# Patient Record
Sex: Male | Born: 1990
Health system: Southern US, Community
[De-identification: ages and names within clinical notes are randomized; demographics above are authoritative.]

---
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
## 2013-12-31 ENCOUNTER — Encounter: Payer: Self-pay | Admitting: Sports Medicine

## 2013-12-31 ENCOUNTER — Ambulatory Visit (INDEPENDENT_AMBULATORY_CARE_PROVIDER_SITE_OTHER): Payer: 59 | Admitting: Sports Medicine

## 2013-12-31 VITALS — BP 130/81 | HR 114 | Ht 62.0 in | Wt 142.0 lb

## 2013-12-31 DIAGNOSIS — M25569 Pain in unspecified knee: Secondary | ICD-10-CM

## 2013-12-31 DIAGNOSIS — M222X9 Patellofemoral disorders, unspecified knee: Secondary | ICD-10-CM | POA: Insufficient documentation

## 2013-12-31 NOTE — Assessment & Plan Note (Signed)
Hip abductors and vastus medialis are poorly defined. Continue OTC analgesics, formal PT. I shouldn't be able to push his legs down after he returns in 6 weeks.

## 2013-12-31 NOTE — Progress Notes (Signed)
   Subjective:    I'm seeing this patient as a consultation for:  Dr. Foy Guadalajara  CC: Bilateral knee pain  HPI: This is a pleasant 23 year old male, he's on and off knee pain he localizes in the anterior aspect worse with flexion of the knees, squatting, going up and downstairs. Recently one of his male friend gave him a dead leg kick in the back of his right knee, he caught his weight with his left knee causing excruciating anterior pain. That has since resolved on its own but he wonders how to prevent this in the future. Pain is now mild, persistent.  Past medical history, Surgical history, Family history not pertinant except as noted below, Social history, Allergies, and medications have been entered into the medical record, reviewed, and no changes needed.   Review of Systems: No headache, visual changes, nausea, vomiting, diarrhea, constipation, dizziness, abdominal pain, skin rash, fevers, chills, night sweats, weight loss, swollen lymph nodes, body aches, joint swelling, muscle aches, chest pain, shortness of breath, mood changes, visual or auditory hallucinations.   Objective:   General: Well Developed, well nourished, and in no acute distress.  Neuro/Psych: Alert and oriented x3, extra-ocular muscles intact, able to move all 4 extremities, sensation grossly intact. Skin: Warm and dry, no rashes noted.  Respiratory: Not using accessory muscles, speaking in full sentences, trachea midline.  Cardiovascular: Pulses palpable, no extremity edema. Abdomen: Does not appear distended. Bilateral Knee: Normal to inspection with no erythema or effusion or obvious bony abnormalities. Tender to palpation over the medial and lateral patellar facets bilaterally. ROM full in flexion and extension and lower leg rotation. Ligaments with solid consistent endpoints including ACL, PCL, LCL, MCL. Negative Mcmurray's, Apley's, and Thessalonian tests. Non painful patellar compression. Patellar glide without  crepitus. Patellar and quadriceps tendons unremarkable. Hamstring and quadriceps strength is normal.  Poor vastus medialis definition, exquisitely weak hip abductor's.  Impression and Recommendations:   This case required medical decision making of moderate complexity.

## 2014-01-02 ENCOUNTER — Ambulatory Visit (INDEPENDENT_AMBULATORY_CARE_PROVIDER_SITE_OTHER): Payer: 59 | Admitting: Physical Therapy

## 2014-01-02 DIAGNOSIS — R262 Difficulty in walking, not elsewhere classified: Secondary | ICD-10-CM

## 2014-01-02 DIAGNOSIS — M25569 Pain in unspecified knee: Secondary | ICD-10-CM

## 2014-01-02 DIAGNOSIS — M6281 Muscle weakness (generalized): Secondary | ICD-10-CM

## 2014-01-07 ENCOUNTER — Encounter (INDEPENDENT_AMBULATORY_CARE_PROVIDER_SITE_OTHER): Payer: 59 | Admitting: Physical Therapy

## 2014-01-07 DIAGNOSIS — R262 Difficulty in walking, not elsewhere classified: Secondary | ICD-10-CM

## 2014-01-07 DIAGNOSIS — M6281 Muscle weakness (generalized): Secondary | ICD-10-CM

## 2014-01-07 DIAGNOSIS — M25569 Pain in unspecified knee: Secondary | ICD-10-CM

## 2014-01-09 ENCOUNTER — Encounter: Payer: 59 | Admitting: Physical Therapy

## 2014-01-09 DIAGNOSIS — M25569 Pain in unspecified knee: Secondary | ICD-10-CM

## 2014-01-09 DIAGNOSIS — R262 Difficulty in walking, not elsewhere classified: Secondary | ICD-10-CM

## 2014-01-09 DIAGNOSIS — M6281 Muscle weakness (generalized): Secondary | ICD-10-CM

## 2014-01-12 ENCOUNTER — Encounter: Payer: 59 | Admitting: Physical Therapy

## 2014-01-14 ENCOUNTER — Encounter (INDEPENDENT_AMBULATORY_CARE_PROVIDER_SITE_OTHER): Payer: 59 | Admitting: Physical Therapy

## 2014-01-14 DIAGNOSIS — M6281 Muscle weakness (generalized): Secondary | ICD-10-CM

## 2014-01-14 DIAGNOSIS — M25569 Pain in unspecified knee: Secondary | ICD-10-CM

## 2014-01-14 DIAGNOSIS — R262 Difficulty in walking, not elsewhere classified: Secondary | ICD-10-CM

## 2014-01-20 ENCOUNTER — Encounter (INDEPENDENT_AMBULATORY_CARE_PROVIDER_SITE_OTHER): Payer: 59 | Admitting: Physical Therapy

## 2014-01-20 DIAGNOSIS — M25569 Pain in unspecified knee: Secondary | ICD-10-CM

## 2014-01-20 DIAGNOSIS — M6281 Muscle weakness (generalized): Secondary | ICD-10-CM

## 2014-01-20 DIAGNOSIS — R262 Difficulty in walking, not elsewhere classified: Secondary | ICD-10-CM

## 2014-01-22 ENCOUNTER — Encounter (INDEPENDENT_AMBULATORY_CARE_PROVIDER_SITE_OTHER): Payer: 59 | Admitting: Physical Therapy

## 2014-01-22 DIAGNOSIS — M6281 Muscle weakness (generalized): Secondary | ICD-10-CM

## 2014-01-22 DIAGNOSIS — R262 Difficulty in walking, not elsewhere classified: Secondary | ICD-10-CM

## 2014-01-22 DIAGNOSIS — M25569 Pain in unspecified knee: Secondary | ICD-10-CM

## 2014-01-26 ENCOUNTER — Encounter (INDEPENDENT_AMBULATORY_CARE_PROVIDER_SITE_OTHER): Payer: 59 | Admitting: Physical Therapy

## 2014-01-26 DIAGNOSIS — R262 Difficulty in walking, not elsewhere classified: Secondary | ICD-10-CM

## 2014-01-26 DIAGNOSIS — M6281 Muscle weakness (generalized): Secondary | ICD-10-CM

## 2014-01-26 DIAGNOSIS — M25569 Pain in unspecified knee: Secondary | ICD-10-CM

## 2014-02-10 ENCOUNTER — Telehealth: Payer: Self-pay | Admitting: Sports Medicine

## 2014-02-10 NOTE — Telephone Encounter (Signed)
Mom called. She wants to know if Fayrene FearingJames can go back to work.  She has scheduled him for an appt for rechk.

## 2014-02-10 NOTE — Telephone Encounter (Signed)
If he is doing better, okay to go to work.

## 2014-02-17 ENCOUNTER — Ambulatory Visit (INDEPENDENT_AMBULATORY_CARE_PROVIDER_SITE_OTHER): Payer: 59 | Admitting: Sports Medicine

## 2014-02-17 VITALS — BP 118/70 | HR 79 | Ht 75.0 in | Wt 140.0 lb

## 2014-02-17 DIAGNOSIS — M25569 Pain in unspecified knee: Secondary | ICD-10-CM

## 2014-02-17 DIAGNOSIS — M222X9 Patellofemoral disorders, unspecified knee: Secondary | ICD-10-CM

## 2014-02-17 NOTE — Progress Notes (Signed)
  Subjective:    CC: Followup  HPI: Bilateral patellofemoral syndrome: Resolved with formal physical therapy, Mobic, and hip abductor rehabilitation.  Past medical history, Surgical history, Family history not pertinant except as noted below, Social history, Allergies, and medications have been entered into the medical record, reviewed, and no changes needed.   Review of Systems: No fevers, chills, night sweats, weight loss, chest pain, or shortness of breath.   Objective:    General: Well Developed, well nourished, and in no acute distress.  Neuro: Alert and oriented x3, extra-ocular muscles intact, sensation grossly intact.  HEENT: Normocephalic, atraumatic, pupils equal round reactive to light, neck supple, no masses, no lymphadenopathy, thyroid nonpalpable.  Skin: Warm and dry, no rashes. Cardiac: Regular rate and rhythm, no murmurs rubs or gallops, no lower extremity edema.  Respiratory: Clear to auscultation bilaterally. Not using accessory muscles, speaking in full sentences. Bilateral Knee: Normal to inspection with no erythema or effusion or obvious bony abnormalities. Palpation normal with no warmth or joint line tenderness or patellar tenderness or condyle tenderness. ROM normal in flexion and extension and lower leg rotation. Ligaments with solid consistent endpoints including ACL, PCL, LCL, MCL. Negative Mcmurray's and provocative meniscal tests. Non painful patellar compression. Patellar and quadriceps tendons unremarkable. Hamstring and quadriceps strength is normal. Good hip abduction strength.  Impression and Recommendations:

## 2014-02-17 NOTE — Assessment & Plan Note (Signed)
Pain-free after formal physical therapy. Hip abductor strength is now excellent. Return for custom orthotics. I will turn him loose afterwards.

## 2014-02-26 ENCOUNTER — Ambulatory Visit (INDEPENDENT_AMBULATORY_CARE_PROVIDER_SITE_OTHER): Payer: 59 | Admitting: Sports Medicine

## 2014-02-26 ENCOUNTER — Encounter: Payer: Self-pay | Admitting: Sports Medicine

## 2014-02-26 VITALS — BP 121/65 | HR 74 | Ht 75.0 in | Wt 139.0 lb

## 2014-02-26 DIAGNOSIS — M222X9 Patellofemoral disorders, unspecified knee: Secondary | ICD-10-CM

## 2014-02-26 DIAGNOSIS — M25569 Pain in unspecified knee: Secondary | ICD-10-CM

## 2014-02-26 NOTE — Assessment & Plan Note (Signed)
Continues to do well, custom orthotics as above. 

## 2014-02-26 NOTE — Progress Notes (Signed)

## 2015-02-23 ENCOUNTER — Ambulatory Visit (INDEPENDENT_AMBULATORY_CARE_PROVIDER_SITE_OTHER): Payer: 59 | Admitting: Sports Medicine

## 2015-02-23 ENCOUNTER — Encounter: Payer: Self-pay | Admitting: Sports Medicine

## 2015-02-23 ENCOUNTER — Ambulatory Visit (INDEPENDENT_AMBULATORY_CARE_PROVIDER_SITE_OTHER): Payer: 59

## 2015-02-23 VITALS — BP 128/66 | HR 78 | Ht 74.0 in | Wt 144.0 lb

## 2015-02-23 DIAGNOSIS — R0789 Other chest pain: Secondary | ICD-10-CM | POA: Diagnosis not present

## 2015-02-23 DIAGNOSIS — R0781 Pleurodynia: Secondary | ICD-10-CM | POA: Diagnosis not present

## 2015-02-23 LAB — COMPREHENSIVE METABOLIC PANEL
ALT: 16 U/L (ref 9–46)
AST: 13 U/L (ref 10–40)
Albumin: 4.7 g/dL (ref 3.6–5.1)
Alkaline Phosphatase: 73 U/L (ref 40–115)
BUN: 13 mg/dL (ref 7–25)
CO2: 26 mEq/L (ref 20–31)
Calcium: 9.2 mg/dL (ref 8.6–10.3)
Chloride: 106 mEq/L (ref 98–110)
Creat: 0.71 mg/dL (ref 0.60–1.35)
Glucose, Bld: 62 mg/dL — ABNORMAL LOW (ref 65–99)
Potassium: 4.1 mEq/L (ref 3.5–5.3)
Sodium: 140 mEq/L (ref 135–146)
Total Bilirubin: 3.2 mg/dL — ABNORMAL HIGH (ref 0.2–1.2)
Total Protein: 6.4 g/dL (ref 6.1–8.1)

## 2015-02-23 LAB — CBC
HCT: 44.5 % (ref 39.0–52.0)
Hemoglobin: 15.3 g/dL (ref 13.0–17.0)
MCH: 30.4 pg (ref 26.0–34.0)
MCHC: 34.4 g/dL (ref 30.0–36.0)
MCV: 88.5 fL (ref 78.0–100.0)
MPV: 9.3 fL (ref 8.6–12.4)
Platelets: 214 10*3/uL (ref 150–400)
RBC: 5.03 MIL/uL (ref 4.22–5.81)
RDW: 13.9 % (ref 11.5–15.5)
WBC: 5.9 10*3/uL (ref 4.0–10.5)

## 2015-02-23 LAB — CK: Total CK: 30 U/L (ref 7–232)

## 2015-02-23 NOTE — Progress Notes (Signed)
   Subjective:    I'm seeing this patient as a consultation for:  Dr. Foy Guadalajara  CC: Chest pain  HPI: This is a pleasant 24 year old male, he is referred to me for further evaluation of persistent pain that he's had in the left side of his chest for approximately 6 months now. He was in a weightlifting class, and started to have pain. 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 Unfortunately the pain is moderate, persistent and today is localized just below the nipple line to the left of the midline at the costochondral junction. Denies any trauma, pain is only minimally pleuritic, no anginal type pain with exertion, no sweating, diaphoresis, nausea. No other risk factors for thromboembolism. No constitutional symptoms.  Past medical history, Surgical history, Family history not pertinant except as noted below, Social history, Allergies, and medications have been entered into the medical record, reviewed, and no changes needed.   Review of Systems: No headache, visual changes, nausea, vomiting, diarrhea, constipation, dizziness, abdominal pain, skin rash, fevers, chills, night sweats, weight loss, swollen lymph nodes, body aches, joint swelling, muscle aches, chest pain, shortness of breath, mood changes, visual or auditory hallucinations.   Objective:   General: Well Developed, well nourished, and in no acute distress.  Neuro/Psych: Alert and oriented x3, extra-ocular muscles intact, able to move all 4 extremities, sensation grossly intact. Skin: Warm and dry, no rashes noted.  Respiratory: Not using accessory muscles, speaking in full sentences, trachea midline. Lungs are clear to auscultation bilaterally, there is tenderness to palpation at the left costochondral junction below the nipple. Cardiovascular: Pulses palpable, no extremity edema. Regular rate and rhythm, no murmurs, rubs, or gallops. Abdomen: Does not appear distended.  The chest was strapped with  compressive dressing. This resolved his pain.  Chest x-ray/rib series is negative.  Impression and Recommendations:   This case required medical decision making of moderate complexity.

## 2015-02-23 NOTE — Assessment & Plan Note (Signed)
Unlikely cardiac or thrombi embolism. Tender to palpation along the left costochondral junction below the nipple. This likely represents costochondritis versus rib stress fracture. Rib x-rays, meloxicam, strapped chest. We are going to check some blood work, he did start a weightlifting class, we are going to look at his d-dimer and CK levels, looking for rhabdomyolysis.

## 2015-02-24 LAB — VITAMIN D 25 HYDROXY (VIT D DEFICIENCY, FRACTURES): Vit D, 25-Hydroxy: 31 ng/mL (ref 30–100)

## 2015-02-24 LAB — D-DIMER, QUANTITATIVE: D-Dimer, Quant: 0.27 ug/mL-FEU (ref 0.00–0.48)

## 2015-04-02 ENCOUNTER — Ambulatory Visit (INDEPENDENT_AMBULATORY_CARE_PROVIDER_SITE_OTHER): Payer: 59

## 2015-04-02 ENCOUNTER — Ambulatory Visit (INDEPENDENT_AMBULATORY_CARE_PROVIDER_SITE_OTHER): Payer: 59 | Admitting: Sports Medicine

## 2015-04-02 ENCOUNTER — Ambulatory Visit: Payer: 59 | Admitting: Physician Assistant

## 2015-04-02 ENCOUNTER — Encounter: Payer: Self-pay | Admitting: Sports Medicine

## 2015-04-02 DIAGNOSIS — M222X9 Patellofemoral disorders, unspecified knee: Secondary | ICD-10-CM

## 2015-04-02 DIAGNOSIS — R0789 Other chest pain: Secondary | ICD-10-CM | POA: Diagnosis not present

## 2015-04-02 DIAGNOSIS — M954 Acquired deformity of chest and rib: Secondary | ICD-10-CM | POA: Diagnosis not present

## 2015-04-02 MED ORDER — IOHEXOL 300 MG/ML  SOLN
75.0000 mL | Freq: Once | INTRAMUSCULAR | Status: AC | PRN
  Administered 2015-04-02: 75 mL via INTRAVENOUS

## 2015-04-02 NOTE — Assessment & Plan Note (Signed)
Asymptomatic and no treatment needed.

## 2015-04-02 NOTE — Assessment & Plan Note (Signed)
All but resolved with rehabilitation exercises and custom orthotics.

## 2015-04-02 NOTE — Progress Notes (Signed)
  Subjective:    CC: Follow-up  HPI: Bruce Hudson returns, we treated him for left clinical costochondritis at the last visit, he does have pectus excavatum, and frequently has recurrence of left costochondral pain.  He did extremely well with oral NSAIDs, as well as strapping of the chest wall, and pain is only minimal and intermittent now. He still has a fairly significant prominence of the left anterior chest wall below the nipple.  Patellofemoral pain syndrome: All but resolved with custom orthotics and home rehabilitation exercises.  Hyperbilirubinemia: Most likely related to Gilbert's syndrome, all other liver function tests are unremarkable, no visible jaundice.  Past medical history, Surgical history, Family history not pertinant except as noted below, Social history, Allergies, and medications have been entered into the medical record, reviewed, and no changes needed.   Review of Systems: No fevers, chills, night sweats, weight loss, chest pain, or shortness of breath.   Objective:    General: Well Developed, well nourished, and in no acute distress.  Neuro: Alert and oriented x3, extra-ocular muscles intact, sensation grossly intact.  HEENT: Normocephalic, atraumatic, pupils equal round reactive to light, neck supple, no masses, no lymphadenopathy, thyroid nonpalpable.  Skin: Warm and dry, no rashes. Cardiac: Regular rate and rhythm, no murmurs rubs or gallops, no lower extremity edema.  Respiratory: Clear to auscultation bilaterally. Not using accessory muscles, speaking in full sentences. Still has minimal tenderness palpation over the costochondral junction at the nipple line on the left side with visible and palpable prominence of the chest wall on the side.  CT scan reviewed and shows pectus excavated with sclerosis of all bones, and significant prominence of the left costochondral junction, the sternum is rotated.  Impression and Recommendations:    I spent 25 minutes with this  patient, greater than 50% was face-to-face time counseling regarding the above diagnoses

## 2015-04-02 NOTE — Assessment & Plan Note (Signed)
Improved significantly with conservative measures, still has some protrusion of the left-sided costal cartilage, we are going to CT scan with IV contrast for further evaluation.

## 2015-04-06 ENCOUNTER — Other Ambulatory Visit: Payer: Self-pay | Admitting: Sports Medicine

## 2015-04-06 MED ORDER — MELOXICAM 15 MG PO TABS: 15.0000 mg | ORAL_TABLET | Freq: Every day | ORAL | Status: DC

## 2015-04-06 NOTE — Telephone Encounter (Signed)
Pt wife called to request a refill on Pt's anti-inflammatory Rx. Meloxicam  one tablet daily. Will route to PCP for review.

## 2015-05-07 ENCOUNTER — Ambulatory Visit: Payer: 59 | Admitting: Sports Medicine

## 2015-05-12 ENCOUNTER — Other Ambulatory Visit: Payer: Self-pay | Admitting: Sports Medicine

## 2015-06-11 ENCOUNTER — Encounter: Payer: Self-pay | Admitting: Sports Medicine

## 2015-06-11 ENCOUNTER — Ambulatory Visit (INDEPENDENT_AMBULATORY_CARE_PROVIDER_SITE_OTHER): Payer: 59 | Admitting: Sports Medicine

## 2015-06-11 VITALS — BP 120/71 | HR 92 | Wt 140.0 lb

## 2015-06-11 DIAGNOSIS — R0789 Other chest pain: Secondary | ICD-10-CM | POA: Diagnosis not present

## 2015-06-11 NOTE — Progress Notes (Signed)
  Subjective:    CC: Follow-up  HPI: Pectus excavatum:  Fayrene FearingJames returns, he had some asymmetry over his left anterior chest wall, he has known pectus excavated and has been working aggressively on [employer] isotonics, overall his pain is significantly decreased simply with NSAIDs and chest rehabilitation. He only has minimal concern about the appearance and the asymmetry, and agrees to work harder on building chest bulk in the gym. CT scan was negative, and no further evaluation is needed, patient understands this.   Past medical history, Surgical history, Family history not pertinant except as noted below, Social history, Allergies, and medications have been entered into the medical record, reviewed, and no changes needed.   Review of Systems: No fevers, chills, night sweats, weight loss, chest pain, or shortness of breath.   Objective:    General: Well Developed, well nourished, and in no acute distress.  Neuro: Alert and oriented x3, extra-ocular muscles intact, sensation grossly intact.  HEENT: Normocephalic, atraumatic, pupils equal round reactive to light, neck supple, no masses, no lymphadenopathy, thyroid nonpalpable.  Skin: Warm and dry, no rashes. Cardiac: Regular rate and rhythm, no murmurs rubs or gallops, no lower extremity edema.  Respiratory: Clear to auscultation bilaterally. Not using accessory muscles, speaking in full sentences.  CT scan reviewed, there is visible pectus excavated with rotation of the sternum, no visible masses, no other abnormalities.  Impression and Recommendations:

## 2015-06-11 NOTE — Assessment & Plan Note (Signed)
Pectus excavatum, normal CT scan with the exception of some rotation of the sternum, no further intervention needed, he is working on chest bulk in the gym, and NSAIDs have provided good pain relief.

## 2015-06-24 ENCOUNTER — Other Ambulatory Visit: Payer: Self-pay | Admitting: Sports Medicine

## 2015-11-03 ENCOUNTER — Other Ambulatory Visit: Payer: Self-pay | Admitting: Sports Medicine

## 2015-11-04 ENCOUNTER — Ambulatory Visit (INDEPENDENT_AMBULATORY_CARE_PROVIDER_SITE_OTHER): Payer: 59 | Admitting: Sports Medicine

## 2015-11-04 ENCOUNTER — Encounter: Payer: Self-pay | Admitting: Sports Medicine

## 2015-11-04 VITALS — BP 138/89 | HR 101 | Resp 18 | Wt 141.9 lb

## 2015-11-04 DIAGNOSIS — F988 Other specified behavioral and emotional disorders with onset usually occurring in childhood and adolescence: Secondary | ICD-10-CM | POA: Insufficient documentation

## 2015-11-04 DIAGNOSIS — F909 Attention-deficit hyperactivity disorder, unspecified type: Secondary | ICD-10-CM

## 2015-11-04 DIAGNOSIS — F419 Anxiety disorder, unspecified: Principal | ICD-10-CM

## 2015-11-04 DIAGNOSIS — F418 Other specified anxiety disorders: Secondary | ICD-10-CM | POA: Diagnosis not present

## 2015-11-04 DIAGNOSIS — F32A Depression, unspecified: Secondary | ICD-10-CM | POA: Insufficient documentation

## 2015-11-04 DIAGNOSIS — F329 Major depressive disorder, single episode, unspecified: Secondary | ICD-10-CM

## 2015-11-04 MED ORDER — CITALOPRAM HYDROBROMIDE 10 MG PO TABS: 10.0000 mg | ORAL_TABLET | Freq: Every day | ORAL | Status: DC

## 2015-11-04 MED ORDER — MELOXICAM 15 MG PO TABS: 15.0000 mg | ORAL_TABLET | Freq: Every day | ORAL | Status: DC

## 2015-11-04 MED ORDER — LISDEXAMFETAMINE DIMESYLATE 20 MG PO CAPS: ORAL_CAPSULE | ORAL | Status: DC

## 2015-11-04 NOTE — Assessment & Plan Note (Signed)
Has never had full neuropsychiatric testing for attention deficit disorder, I do suspect his underlying diagnosis has been anxiety and depression all alone. Down taper of Vyvanse, and starting Celexa as below.

## 2015-11-04 NOTE — Progress Notes (Signed)
  Subjective:    CC:  Follow-up  HPI: This is a pleasant 25 year old male, he has an existing diagnosis of attention deficit disorder, he tells me that he never had neuropsychiatric testing, simply was placed on Vyvanse, and hasn't really noted a huge improvement in his symptoms,  He does tell me he endorses more anxiety and depressed mood, he is agreeable to try an antidepressant and do a trial off of his stimulant.  Past medical history, Surgical history, Family history not pertinant except as noted below, Social history, Allergies, and medications have been entered into the medical record, reviewed, and no changes needed.   Review of Systems: No fevers, chills, night sweats, weight loss, chest pain, or shortness of breath.   Objective:    General: Well Developed, well nourished, and in no acute distress.  Neuro: Alert and oriented x3, extra-ocular muscles intact, sensation grossly intact.  HEENT: Normocephalic, atraumatic, pupils equal round reactive to light, neck supple, no masses, no lymphadenopathy, thyroid nonpalpable.  Skin: Warm and dry, no rashes. Cardiac: Regular rate and rhythm, no murmurs rubs or gallops, no lower extremity edema.  Respiratory: Clear to auscultation bilaterally. Not using accessory muscles, speaking in full sentences.  Impression and Recommendations:    I spent 25 minutes with this patient, greater than 50% was face-to-face time counseling regarding the above diagnoses

## 2015-11-04 NOTE — Assessment & Plan Note (Signed)
Starting Celexa 10, return in one month for a PHQ9 and GAD7

## 2015-11-08 ENCOUNTER — Ambulatory Visit: Payer: 59 | Admitting: Sports Medicine

## 2015-12-06 ENCOUNTER — Encounter: Payer: Self-pay | Admitting: Sports Medicine

## 2015-12-06 ENCOUNTER — Ambulatory Visit (INDEPENDENT_AMBULATORY_CARE_PROVIDER_SITE_OTHER): Payer: 59 | Admitting: Sports Medicine

## 2015-12-06 VITALS — BP 137/82 | HR 86 | Resp 16 | Wt 145.2 lb

## 2015-12-06 DIAGNOSIS — F419 Anxiety disorder, unspecified: Principal | ICD-10-CM

## 2015-12-06 DIAGNOSIS — F329 Major depressive disorder, single episode, unspecified: Secondary | ICD-10-CM

## 2015-12-06 DIAGNOSIS — F32A Depression, unspecified: Secondary | ICD-10-CM

## 2015-12-06 DIAGNOSIS — F418 Other specified anxiety disorders: Secondary | ICD-10-CM | POA: Diagnosis not present

## 2015-12-06 MED ORDER — CITALOPRAM HYDROBROMIDE 10 MG PO TABS: 10.0000 mg | ORAL_TABLET | Freq: Every day | ORAL | Status: DC

## 2015-12-06 NOTE — Assessment & Plan Note (Signed)
Fantastic improvement in symptoms on 10 mg of Celexa, symptoms are well-controlled, concentration has improved, and he is off of Vyvanse. As discussed previously, I do think he actually has a diagnosis of attention deficit disorder. Refilling medication, return in one year

## 2015-12-06 NOTE — Progress Notes (Signed)
  Subjective:    CC: follow-up  HPI: This is a pleasant 25 year old male, he comes in with fantastic improvements on 10 mg of Celexa and coming off of his Vyvanse, concentration really hasn't worsened, and is controlled right now off of his stimulant. He endorses moderate difficulty sleeping, mild anhedonia, guilt, and difficulty concentrating.Bruce Hudson has mild anxiety, worry, and fear of impending doom. All of these symptoms have improved significantly since his last visit.  Past medical history, Surgical history, Family history not pertinant except as noted below, Social history, Allergies, and medications have been entered into the medical record, reviewed, and no changes needed.   Review of Systems: No fevers, chills, night sweats, weight loss, chest pain, or shortness of breath.   Objective:    General: Well Developed, well nourished, and in no acute distress.  Neuro: Alert and oriented x3, extra-ocular muscles intact, sensation grossly intact.  HEENT: Normocephalic, atraumatic, pupils equal round reactive to light, neck supple, no masses, no lymphadenopathy, thyroid nonpalpable.  Skin: Warm and dry, no rashes. Cardiac: Regular rate and rhythm, no murmurs rubs or gallops, no lower extremity edema.  Respiratory: Clear to auscultation bilaterally. Not using accessory muscles, speaking in full sentences.  Impression and Recommendations:

## 2016-01-17 ENCOUNTER — Telehealth: Payer: Self-pay | Admitting: Sports Medicine

## 2016-01-17 DIAGNOSIS — F329 Major depressive disorder, single episode, unspecified: Secondary | ICD-10-CM

## 2016-01-17 DIAGNOSIS — F32A Depression, unspecified: Secondary | ICD-10-CM

## 2016-01-17 DIAGNOSIS — F419 Anxiety disorder, unspecified: Principal | ICD-10-CM

## 2016-01-17 MED ORDER — CITALOPRAM HYDROBROMIDE 20 MG PO TABS: 20.0000 mg | ORAL_TABLET | Freq: Every day | ORAL | Status: DC

## 2016-01-17 NOTE — Telephone Encounter (Signed)
Increased to 20 mg, max dose is 40 mg.

## 2016-01-17 NOTE — Telephone Encounter (Signed)
Pt called clinic to see if Celexa could be increased? Pt states it is not helping as well as it did. Will route to PCP for review.

## 2016-01-18 NOTE — Telephone Encounter (Signed)
Pt advised of new Rx. Verbalized understanding, no further questions.

## 2016-06-04 ENCOUNTER — Other Ambulatory Visit: Payer: Self-pay | Admitting: Sports Medicine

## 2016-06-04 DIAGNOSIS — F32A Depression, unspecified: Secondary | ICD-10-CM

## 2016-06-04 DIAGNOSIS — F329 Major depressive disorder, single episode, unspecified: Secondary | ICD-10-CM

## 2016-06-04 DIAGNOSIS — F419 Anxiety disorder, unspecified: Principal | ICD-10-CM

## 2016-12-02 ENCOUNTER — Other Ambulatory Visit: Payer: Self-pay | Admitting: Sports Medicine

## 2016-12-02 DIAGNOSIS — F329 Major depressive disorder, single episode, unspecified: Secondary | ICD-10-CM

## 2016-12-02 DIAGNOSIS — F32A Depression, unspecified: Secondary | ICD-10-CM

## 2016-12-02 DIAGNOSIS — F419 Anxiety disorder, unspecified: Principal | ICD-10-CM

## 2016-12-14 IMAGING — CT CT CHEST W/ CM
3 series · 18 of 29 positions shown, 19 images · IV contrast (omnipaque)
Comparison: Plain film 02/23/2015

CLINICAL DATA: Masses on chest LEFT of mid line. Mass present for
11 years which is half the life of the patient.

EXAM:
CT CHEST WITH CONTRAST
TECHNIQUE: Multidetector CT imaging of the chest was performed during
intravenous contrast administration.
CONTRAST:  75mL OMNIPAQUE IOHEXOL 300 MG/ML  SOLN

[Series 2: chest with · axial · 0.76mm/px · z∈[-329,-149]mm · 4 of 72 slices shown, 5 images]
[im 18/72  mediastinal]
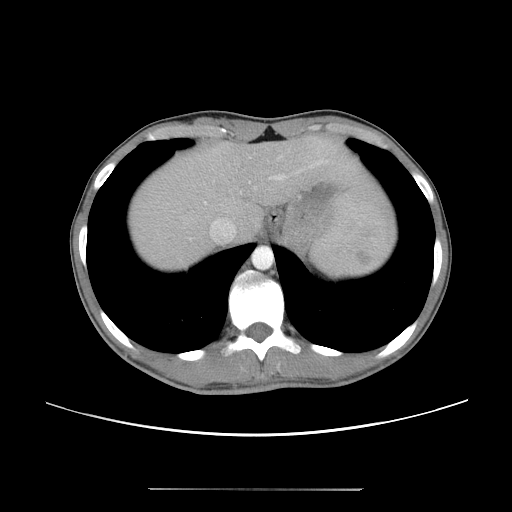
[im 18/72  lung]
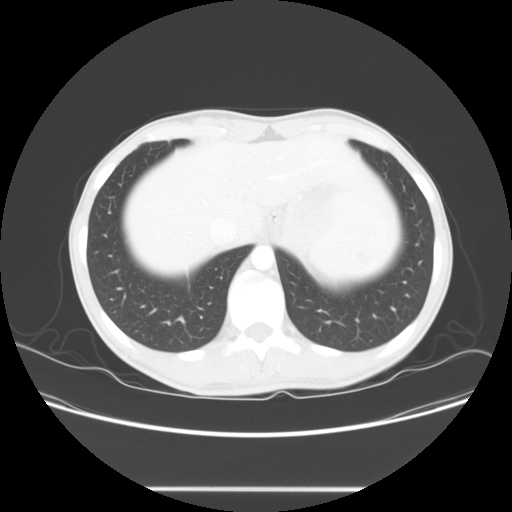
[im 36/72  lung]
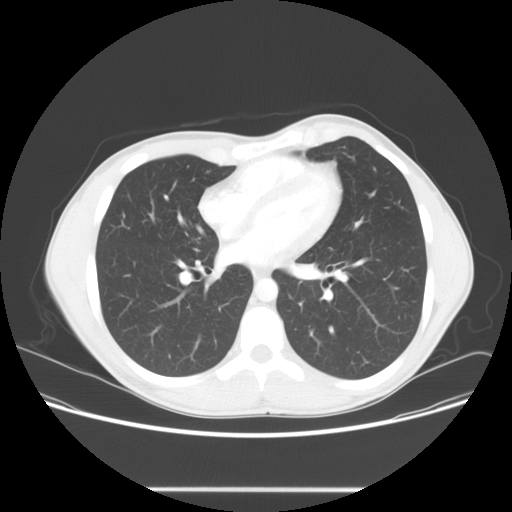
[im 37/72  lung]
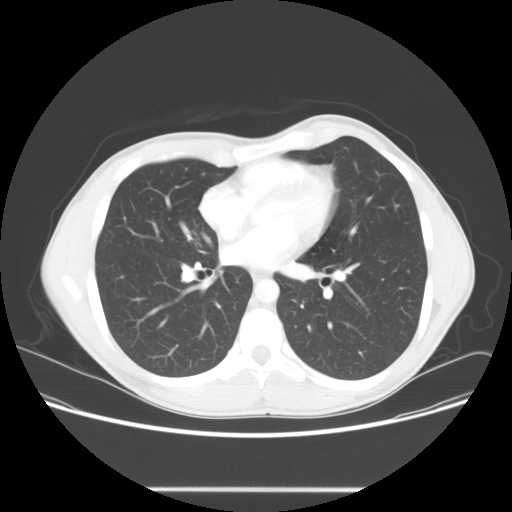
[im 54/72  lung]
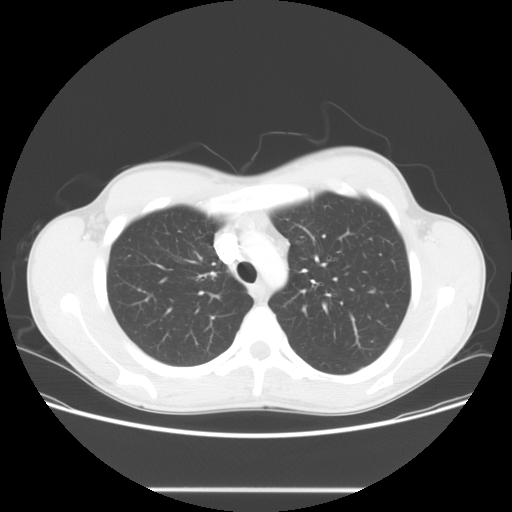

[Series 400: sag · sagittal · 0.76mm/px · 8 of 187 slices shown]
[im 16/187  lung]
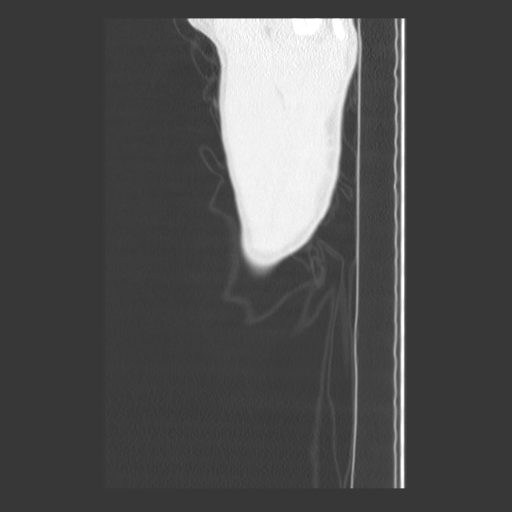
[im 47/187  lung]
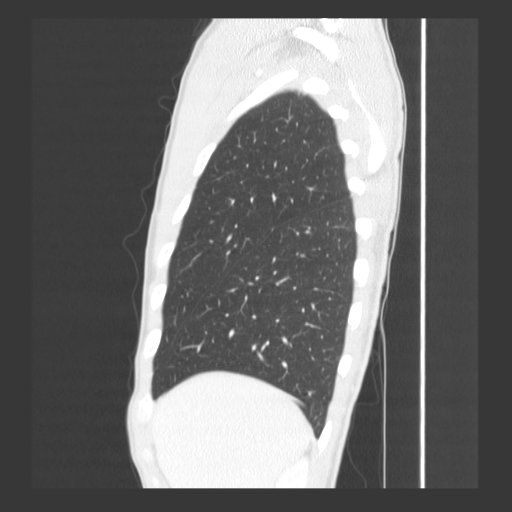
[im 63/187  lung]
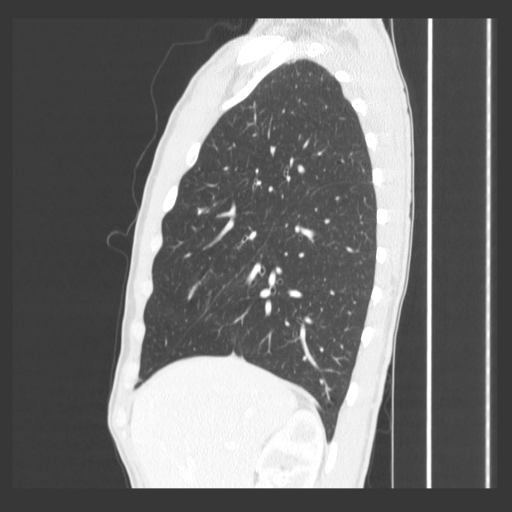
[im 78/187  lung]
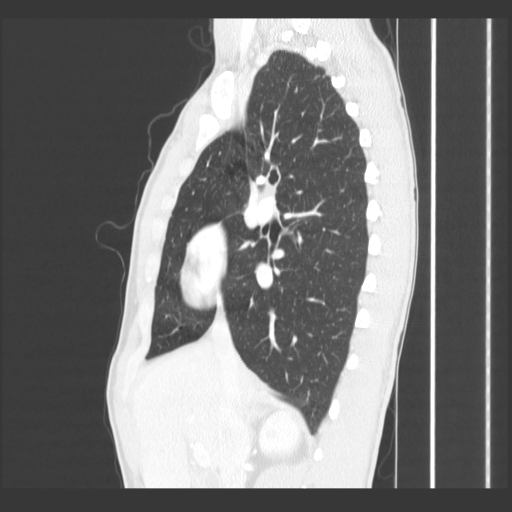
[im 109/187  lung]
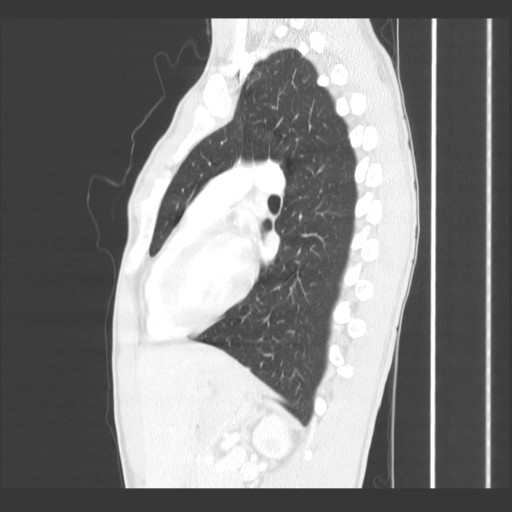
[im 125/187  lung]
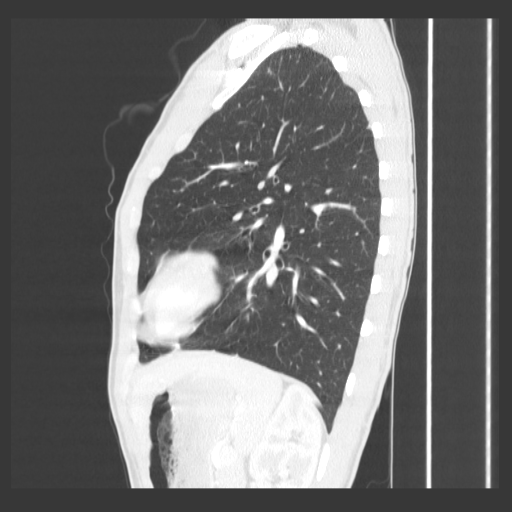
[im 140/187  lung]
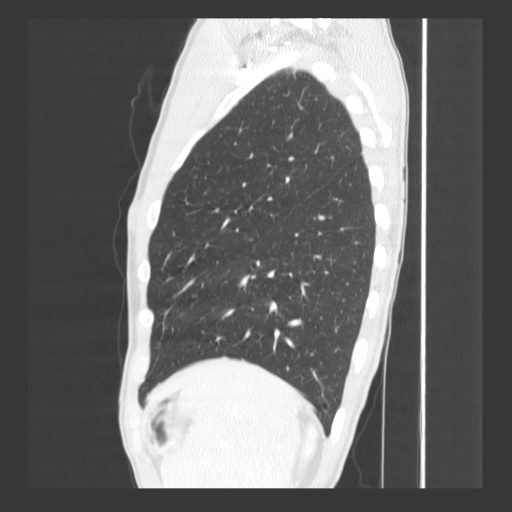
[im 171/187  lung]
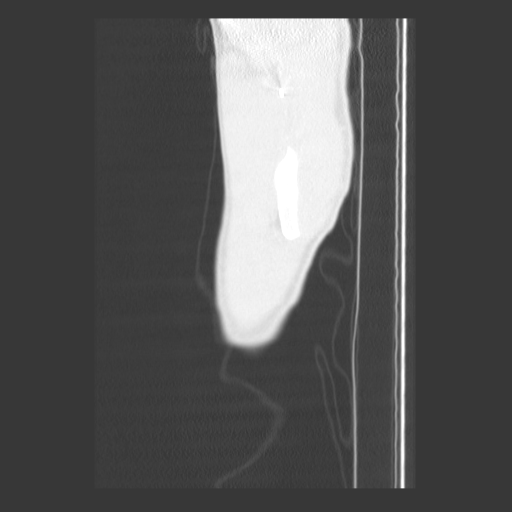

[Series 401: cor · coronal · 0.76mm/px · 6 of 136 slices shown]
[im 16/136  lung]
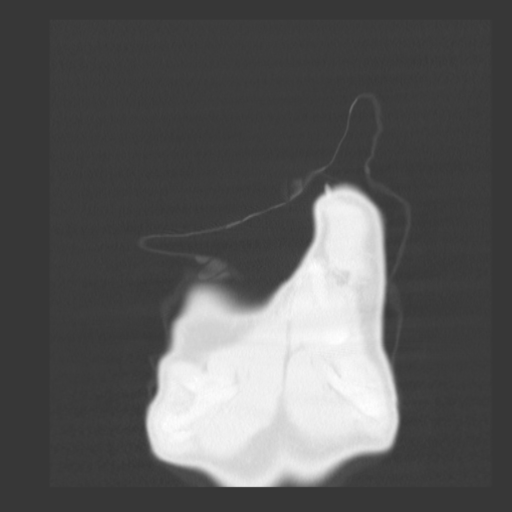
[im 31/136  lung]
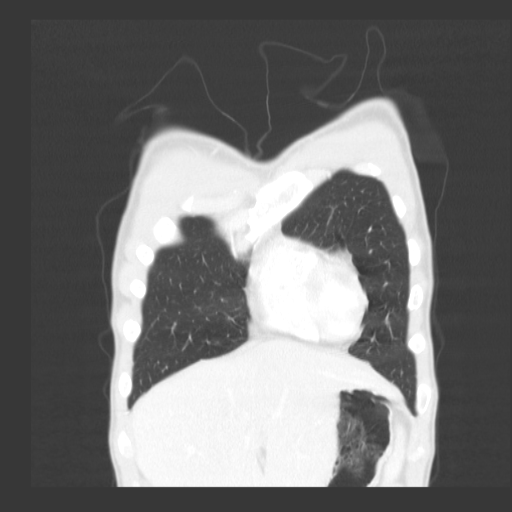
[im 46/136  lung]
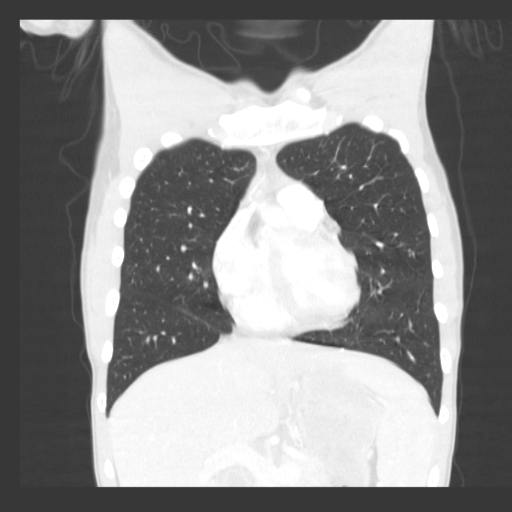
[im 61/136  lung]
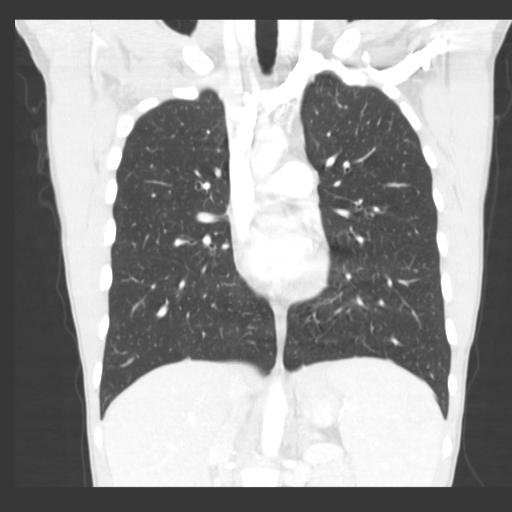
[im 76/136  lung]
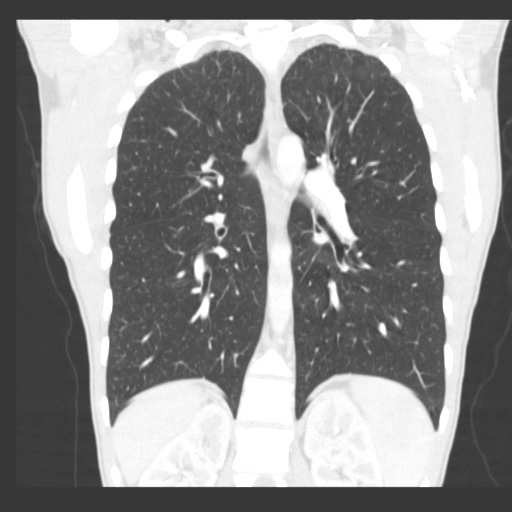
[im 91/136  lung]
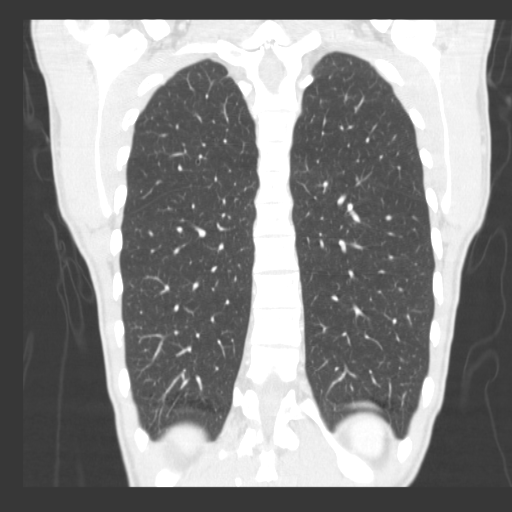

[18 of 29 positions shown; findings below may reference images not displayed]

FINDINGS: Mediastinum/Nodes: No axillary supraclavicular lymphadenopathy. No
mediastinal hilar lymphadenopathy. No pericardial fluid. Esophagus
normal.

Lungs/Pleura: No pulmonary nodularity. Airways are normal. No
pleural fluid.

Upper abdomen: Limited view of the liver, kidneys, pancreas are
unremarkable. Normal adrenal glands. Spleen is only partially imaged
and may be enlarged.

Musculoskeletal: There is deformity of the chest wall with the
sternum tilted anteriorly along its LEFT border which results
prominence of the LEFT anterior chest wall at site of the skin
marker.
IMPRESSION: 1. Pectus excavatum variant with asymmetric compression on the RIGHT
results in chest wall deformity along the medial LEFT chest.
2. Diffuse sclerosis within the bones may relate to patient's
history to better syndrome

## 2017-01-05 ENCOUNTER — Other Ambulatory Visit: Payer: Self-pay | Admitting: Sports Medicine

## 2017-01-05 DIAGNOSIS — F329 Major depressive disorder, single episode, unspecified: Secondary | ICD-10-CM

## 2017-01-05 DIAGNOSIS — F419 Anxiety disorder, unspecified: Principal | ICD-10-CM

## 2017-01-05 DIAGNOSIS — F32A Depression, unspecified: Secondary | ICD-10-CM

## 2017-01-05 MED ORDER — CITALOPRAM HYDROBROMIDE 20 MG PO TABS: 20.0000 mg | ORAL_TABLET | Freq: Every day | ORAL | 3 refills | Status: DC

## 2017-04-18 ENCOUNTER — Other Ambulatory Visit: Payer: Self-pay | Admitting: Sports Medicine

## 2017-04-18 DIAGNOSIS — F419 Anxiety disorder, unspecified: Principal | ICD-10-CM

## 2017-04-18 DIAGNOSIS — F32A Depression, unspecified: Secondary | ICD-10-CM

## 2017-04-18 DIAGNOSIS — F329 Major depressive disorder, single episode, unspecified: Secondary | ICD-10-CM

## 2017-04-20 ENCOUNTER — Encounter: Payer: Self-pay | Admitting: Sports Medicine

## 2017-04-20 ENCOUNTER — Ambulatory Visit (INDEPENDENT_AMBULATORY_CARE_PROVIDER_SITE_OTHER): Payer: BLUE CROSS/BLUE SHIELD | Admitting: Sports Medicine

## 2017-04-20 ENCOUNTER — Telehealth: Payer: Self-pay

## 2017-04-20 DIAGNOSIS — F419 Anxiety disorder, unspecified: Secondary | ICD-10-CM | POA: Diagnosis not present

## 2017-04-20 DIAGNOSIS — K649 Unspecified hemorrhoids: Secondary | ICD-10-CM | POA: Insufficient documentation

## 2017-04-20 DIAGNOSIS — L918 Other hypertrophic disorders of the skin: Secondary | ICD-10-CM | POA: Diagnosis not present

## 2017-04-20 DIAGNOSIS — F32A Depression, unspecified: Secondary | ICD-10-CM

## 2017-04-20 DIAGNOSIS — K625 Hemorrhage of anus and rectum: Secondary | ICD-10-CM | POA: Diagnosis not present

## 2017-04-20 DIAGNOSIS — Z0000 Encounter for general adult medical examination without abnormal findings: Secondary | ICD-10-CM | POA: Diagnosis not present

## 2017-04-20 DIAGNOSIS — F329 Major depressive disorder, single episode, unspecified: Secondary | ICD-10-CM | POA: Diagnosis not present

## 2017-04-20 LAB — HEMOCCULT GUIAC POC 1CARD (OFFICE): Fecal Occult Blood, POC: POSITIVE — AB

## 2017-04-20 MED ORDER — WITCH HAZEL-GLYCERIN EX PADS: 1.0000 "application " | MEDICATED_PAD | CUTANEOUS | 11 refills | Status: DC | PRN

## 2017-04-20 MED ORDER — DOCUSATE SODIUM 100 MG PO CAPS: 100.0000 mg | ORAL_CAPSULE | Freq: Three times a day (TID) | ORAL | 3 refills | Status: DC | PRN

## 2017-04-20 MED ORDER — HYDROCORTISONE ACETATE 25 MG RE SUPP
25.0000 mg | Freq: Two times a day (BID) | RECTAL | 2 refills | Status: DC | PRN
Start: 2017-04-20 — End: 2018-07-02

## 2017-04-20 NOTE — Assessment & Plan Note (Signed)
Rechecking blood work. 

## 2017-04-20 NOTE — Assessment & Plan Note (Signed)
In the past we discontinued Vyvanse and started Celexa, ultimately increased to 20 mg, his concentration improved and mood improved. He is having some relationship stressors, and would like to touch base with behavioral therapy downstairs, referral placed.

## 2017-04-20 NOTE — Assessment & Plan Note (Signed)
Cryotherapy as above. 

## 2017-04-20 NOTE — Assessment & Plan Note (Signed)
Patient will return for a physical but checking routine blood work for now.

## 2017-04-20 NOTE — Assessment & Plan Note (Addendum)
Painless rectal bleeding. Anoscopy performed, there was an external hemorrhoid, and a single internal hemorrhoid, neither appeared to be actively bleeding but Hemoccult was positive. Adding Colace, Anusol suppositories, and witch hazel wipes. Return in one month, if symptoms still present we will discuss referral to gastroenterology.

## 2017-04-20 NOTE — Progress Notes (Signed)
  Subjective:    CC: Several issues  HPI: Generalized anxiety: Bruce Hudson did well with 20 g of Celexa for a while, unfortunately he is having some issues with his girlfriend, they broke up, he is having increased anxiety, and would like to cut to a Community education officer. Otherwise he's happy with the current dose of Celexa, no suicidal or homicidal ideation.  Rectal bleeding: Present on and off for several months, occurs after drinking coffee and when stooling, bright red blood on the 12th, no melena, no hematochezia, no pain.  Lesion on back: Present for years.  Past medical history:  Negative.  See flowsheet/record as well for more information.  Surgical history: Negative.  See flowsheet/record as well for more information.  Family history: Negative.  See flowsheet/record as well for more information.  Social history: Negative.  See flowsheet/record as well for more information.  Allergies, and medications have been entered into the medical record, reviewed, and no changes needed.   Review of Systems: No fevers, chills, night sweats, weight loss, chest pain, or shortness of breath.   Objective:    General: Well Developed, well nourished, and in no acute distress.  Neuro: Alert and oriented x3, extra-ocular muscles intact, sensation grossly intact.  HEENT: Normocephalic, atraumatic, pupils equal round reactive to light, neck supple, no masses, no lymphadenopathy, thyroid nonpalpable.  Skin: Warm and dry, no rashes. Skin tag on the mid back Cardiac: Regular rate and rhythm, no murmurs rubs or gallops, no lower extremity edema.  Respiratory: Clear to auscultation bilaterally. Not using accessory muscles, speaking in full sentences.  Procedure:  Cryodestruction of left mid back skin tag Consent obtained and verified. Time-out conducted. Noted no overlying erythema, induration, or other signs of local infection. Completed without difficulty using Cryo-Gun. Advised to call if fevers/chills,  erythema, induration, drainage, or persistent bleeding.  Anoscopy: Noted internal and external hemorrhoids, not bleeding, Hemoccult from the anoscope was positive.  Impression and Recommendations:    Rectal bleeding Painless rectal bleeding. Anoscopy performed, there was an external hemorrhoid, and a single internal hemorrhoid, neither appeared to be actively bleeding but Hemoccult was positive. Adding Colace, Anusol suppositories, and witch hazel wipes. Return in one month, if symptoms still present we will discuss referral to gastroenterology.  Gilbert syndrome Rechecking blood work  Annual physical exam Patient will return for a physical but checking routine blood work for now.  Anxiety and depression In the past we discontinued Vyvanse and started Celexa, ultimately increased to 20 mg, his concentration improved and mood improved. He is having some relationship stressors, and would like to touch base with behavioral therapy downstairs, referral placed.  ___________________________________________ Ihor Austin. Benjamin Stain, M.D., ABFM., CAQSM. Primary Care and Sports Medicine [facility]  Adjunct Instructor of Family Medicine  [employer]

## 2017-04-20 NOTE — Telephone Encounter (Signed)
Have him find something similar over-the-counter

## 2017-04-20 NOTE — Telephone Encounter (Signed)
Notified patient.

## 2017-04-20 NOTE — Telephone Encounter (Signed)
The suppository that you ordered today is not covered by insurance, is there something else you could order?  Please advise.

## 2017-04-20 NOTE — Patient Instructions (Signed)
Hemorrhoids Hemorrhoids are swollen veins in and around the rectum or anus. There are two types of hemorrhoids:  Internal hemorrhoids. These occur in the veins that are just inside the rectum. They may poke through to the outside and become irritated and painful.  External hemorrhoids. These occur in the veins that are outside of the anus and can be felt as a painful swelling or hard lump near the anus.  Most hemorrhoids do not cause serious problems, and they can be managed with home treatments such as diet and lifestyle changes. If home treatments do not help your symptoms, procedures can be done to shrink or remove the hemorrhoids. What are the causes? This condition is caused by increased pressure in the anal area. This pressure may result from various things, including:  Constipation.  Straining to have a bowel movement.  Diarrhea.  Pregnancy.  Obesity.  Sitting for long periods of time.  Heavy lifting or other activity that causes you to strain.  Anal sex.  What are the signs or symptoms? Symptoms of this condition include:  Pain.  Anal itching or irritation.  Rectal bleeding.  Leakage of stool (feces).  Anal swelling.  One or more lumps around the anus.  How is this diagnosed? This condition can often be diagnosed through a visual exam. Other exams or tests may also be done, such as:  Examination of the rectal area with a gloved hand (digital rectal exam).  Examination of the anal canal using a small tube (anoscope).  A blood test, if you have lost a significant amount of blood.  A test to look inside the colon (sigmoidoscopy or colonoscopy).  How is this treated? This condition can usually be treated at home. However, various procedures may be done if dietary changes, lifestyle changes, and other home treatments do not help your symptoms. These procedures can help make the hemorrhoids smaller or remove them completely. Some of these procedures involve  surgery, and others do not. Common procedures include:  Rubber band ligation. Rubber bands are placed at the base of the hemorrhoids to cut off the blood supply to them.  Sclerotherapy. Medicine is injected into the hemorrhoids to shrink them.  Infrared coagulation. A type of light energy is used to get rid of the hemorrhoids.  Hemorrhoidectomy surgery. The hemorrhoids are surgically removed, and the veins that supply them are tied off.  Stapled hemorrhoidopexy surgery. A circular stapling device is used to remove the hemorrhoids and use staples to cut off the blood supply to them.  Follow these instructions at home: Eating and drinking  Eat foods that have a lot of fiber in them, such as whole grains, beans, nuts, fruits, and vegetables. Ask your health care provider about taking products that have added fiber (fiber supplements).  Drink enough fluid to keep your urine clear or pale yellow. Managing pain and swelling  Take warm sitz baths for 20 minutes, 3-4 times a day to ease pain and discomfort.  If directed, apply ice to the affected area. Using ice packs between sitz baths may be helpful. ? Put ice in a plastic bag. ? Place a towel between your skin and the bag. ? Leave the ice on for 20 minutes, 2-3 times a day. General instructions  Take over-the-counter and prescription medicines only as told by your health care provider.  Use medicated creams or suppositories as told.  Exercise regularly.  Go to the bathroom when you have the urge to have a bowel movement. Do not wait.    Avoid straining to have bowel movements.  Keep the anal area dry and clean. Use wet toilet paper or moist towelettes after a bowel movement.  Do not sit on the toilet for long periods of time. This increases blood pooling and pain. Contact a health care provider if:  You have increasing pain and swelling that are not controlled by treatment or medicine.  You have uncontrolled bleeding.  You  have difficulty having a bowel movement, or you are unable to have a bowel movement.  You have pain or inflammation outside the area of the hemorrhoids. This information is not intended to replace advice given to you by your health care provider. Make sure you discuss any questions you have with your health care provider. Document Released: 07/14/2000 Document Revised: 12/15/2015 Document Reviewed: 03/31/2015 Elsevier Interactive Patient Education  2017 Elsevier Inc.  

## 2017-05-18 ENCOUNTER — Ambulatory Visit: Payer: BLUE CROSS/BLUE SHIELD | Admitting: Sports Medicine

## 2017-05-21 ENCOUNTER — Ambulatory Visit (INDEPENDENT_AMBULATORY_CARE_PROVIDER_SITE_OTHER): Payer: BLUE CROSS/BLUE SHIELD | Admitting: Licensed Clinical Social Worker

## 2017-05-21 ENCOUNTER — Encounter (HOSPITAL_COMMUNITY): Payer: Self-pay | Admitting: Licensed Clinical Social Worker

## 2017-05-21 DIAGNOSIS — F411 Generalized anxiety disorder: Secondary | ICD-10-CM | POA: Diagnosis not present
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
## 2017-05-25 ENCOUNTER — Ambulatory Visit (INDEPENDENT_AMBULATORY_CARE_PROVIDER_SITE_OTHER): Payer: BLUE CROSS/BLUE SHIELD | Admitting: Sports Medicine

## 2017-05-25 ENCOUNTER — Encounter: Payer: Self-pay | Admitting: Sports Medicine

## 2017-05-25 DIAGNOSIS — F329 Major depressive disorder, single episode, unspecified: Secondary | ICD-10-CM | POA: Diagnosis not present

## 2017-05-25 DIAGNOSIS — F419 Anxiety disorder, unspecified: Secondary | ICD-10-CM | POA: Diagnosis not present

## 2017-05-25 DIAGNOSIS — K649 Unspecified hemorrhoids: Secondary | ICD-10-CM

## 2017-05-25 DIAGNOSIS — F32A Depression, unspecified: Secondary | ICD-10-CM

## 2017-05-25 NOTE — Progress Notes (Signed)
  Subjective:    CC: Follow-up  HPI: Rectal bleeding: Anoscopy at the last visit uncovered internal and external hemorrhoids, not actively bleeding but I did Hemoccult the endoscope which was positive.  We started Colace, he did some over-the-counter suppositories and all symptoms have resolved.  Anxiety: Well-controlled, he did a single visit so far with behavioral counseling, is doing well with citalopram, has not needed Adderall, it seems as though his difficulty concentrating was more anxiety and depression rather than true attention deficit disorder.  Past medical history:  Negative.  See flowsheet/record as well for more information.  Surgical history: Negative.  See flowsheet/record as well for more information.  Family history: Negative.  See flowsheet/record as well for more information.  Social history: Negative.  See flowsheet/record as well for more information.  Allergies, and medications have been entered into the medical record, reviewed, and no changes needed.   Review of Systems: No fevers, chills, night sweats, weight loss, chest pain, or shortness of breath.   Objective:    General: Well Developed, well nourished, and in no acute distress.  Neuro: Alert and oriented x3, extra-ocular muscles intact, sensation grossly intact.  HEENT: Normocephalic, atraumatic, pupils equal round reactive to light, neck supple, no masses, no lymphadenopathy, thyroid nonpalpable.  Skin: Warm and dry, no rashes. Cardiac: Regular rate and rhythm, no murmurs rubs or gallops, no lower extremity edema.  Respiratory: Clear to auscultation bilaterally. Not using accessory muscles, speaking in full sentences.  Impression and Recommendations:    Anxiety and depression Doing well, no changes needed  Hemorrhoids Internal and external uncovered on anoscopy at the last visit, stool softeners have resolved all symptoms. ___________________________________________ Ihor Austinhomas J. Benjamin Stainhekkekandam, M.D.,  ABFM., CAQSM. Primary Care and Sports Medicine [facility]  Adjunct Instructor of Family Medicine  [employer]

## 2017-05-25 NOTE — Assessment & Plan Note (Signed)
Doing well, no changes needed. 

## 2017-05-25 NOTE — Assessment & Plan Note (Signed)
Internal and external uncovered on anoscopy at the last visit, stool softeners have resolved all symptoms.

## 2017-05-26 LAB — LIPID PANEL W/REFLEX DIRECT LDL
Cholesterol: 123 mg/dL (ref <200)
HDL: 41 mg/dL (ref >40)
LDL Cholesterol (Calc): 64 mg/dL (calc)
Non-HDL Cholesterol (Calc): 82 mg/dL (calc) (ref <130)
Total CHOL/HDL Ratio: 3 (calc) (ref <5.0)
Triglycerides: 94 mg/dL (ref <150)

## 2017-05-26 LAB — COMPREHENSIVE METABOLIC PANEL
AG Ratio: 2.6 (calc) — ABNORMAL HIGH (ref 1.0–2.5)
ALT: 11 U/L (ref 9–46)
AST: 13 U/L (ref 10–40)
Albumin: 5 g/dL (ref 3.6–5.1)
Alkaline phosphatase (APISO): 69 U/L (ref 40–115)
BUN: 12 mg/dL (ref 7–25)
CO2: 29 mmol/L (ref 20–32)
Calcium: 9.5 mg/dL (ref 8.6–10.3)
Chloride: 104 mmol/L (ref 98–110)
Creat: 0.84 mg/dL (ref 0.60–1.35)
Globulin: 1.9 g/dL (calc) (ref 1.9–3.7)
Glucose, Bld: 77 mg/dL (ref 65–99)
Potassium: 4.2 mmol/L (ref 3.5–5.3)
Sodium: 141 mmol/L (ref 135–146)
Total Bilirubin: 2.5 mg/dL — ABNORMAL HIGH (ref 0.2–1.2)
Total Protein: 6.9 g/dL (ref 6.1–8.1)

## 2017-05-26 LAB — TSH: TSH: 2.72 mIU/L (ref 0.40–4.50)

## 2017-05-26 LAB — CBC
HCT: 45.7 % (ref 38.5–50.0)
Hemoglobin: 15.9 g/dL (ref 13.2–17.1)
MCH: 30.3 pg (ref 27.0–33.0)
MCHC: 34.8 g/dL (ref 32.0–36.0)
MCV: 87 fL (ref 80.0–100.0)
MPV: 10.3 fL (ref 7.5–12.5)
Platelets: 245 10*3/uL (ref 140–400)
RBC: 5.25 10*6/uL (ref 4.20–5.80)
RDW: 12.7 % (ref 11.0–15.0)
WBC: 5 10*3/uL (ref 3.8–10.8)

## 2017-05-26 LAB — APTT: aPTT: 30 s (ref 22–34)

## 2017-05-26 LAB — PROTIME-INR
INR: 1
Prothrombin Time: 11 s (ref 9.0–11.5)

## 2017-05-26 LAB — HEMOGLOBIN A1C
Hgb A1c MFr Bld: 4.7 % of total Hgb (ref <5.7)
Mean Plasma Glucose: 88 (calc)
eAG (mmol/L): 4.9 (calc)

## 2017-05-26 LAB — VITAMIN D 25 HYDROXY (VIT D DEFICIENCY, FRACTURES): Vit D, 25-Hydroxy: 30 ng/mL (ref 30–100)

## 2017-05-26 LAB — BILIRUBIN, FRACTIONATED(TOT/DIR/INDIR)
Bilirubin, Direct: 0.4 mg/dL — ABNORMAL HIGH (ref 0.0–0.2)
Indirect Bilirubin: 2.1 mg/dL (calc) — ABNORMAL HIGH (ref 0.2–1.2)
Total Bilirubin: 2.5 mg/dL — ABNORMAL HIGH (ref 0.2–1.2)

## 2017-05-26 LAB — HIV ANTIBODY (ROUTINE TESTING W REFLEX): HIV 1&2 Ab, 4th Generation: NONREACTIVE

## 2017-06-13 ENCOUNTER — Ambulatory Visit (INDEPENDENT_AMBULATORY_CARE_PROVIDER_SITE_OTHER): Payer: BLUE CROSS/BLUE SHIELD | Admitting: Licensed Clinical Social Worker

## 2017-06-13 DIAGNOSIS — F411 Generalized anxiety disorder: Secondary | ICD-10-CM | POA: Diagnosis not present

## 2017-06-13 NOTE — Progress Notes (Signed)
   THERAPIST PROGRESS NOTE  Session Time: 10:03am-10:53am  Participation Level: Active  Behavioral Response: CasualAlertEuthymic  Type of Therapy: Individual Therapy  Treatment Goals addressed: Reduce anxiety and increase confidence with making decisions independently  Interventions: Treatment planning, psycho-ed about anxiety, relaxation training    Suicidal/Homicidal: Denied both  Therapist Response: Collaborated with patient to develop his treatment plan.  Briefly described interventions he can expect as he participates in therapy. Discussed the fight or flight response and how it is activated whenever you think there is a potential threat.  Noted this happens whether the threat is real or not.  Reviewed physical changes that tend to occur in fight or flight.  Emphasized that anxiety symptoms are not dangerous. Introduced a relaxation exercise called the 4-7-8 Breath.  Had patient watch a video of someone demonstrating the technique.  Encouraged him to practice the exercise daily as well as times when he feels stressed.    Summary:  Reported feeling more stressed than usual this week.  Attributed it to working on a different kind of photography project and feeling uncertain about whether or not to stay at his current job. Pretty familiar with fight or flight.  Noted he can usually tell he is anxious if he has a feeling of extra weight on his shoulders, his body is tense, and he feels restless.  Noted if his anxiety persists he typically chooses to isolate rather than socialize with others.   Indicated he is open to practicing the breathing exercise.  Mentioned that he already does something similar just before going to sleep.      Plan: Next time introduce Mindfulness  Diagnosis: Generalized Anxiety Disorder    Darrin LuisSolomon, Sarah A, LCSW 06/13/2017

## 2017-07-06 ENCOUNTER — Ambulatory Visit (INDEPENDENT_AMBULATORY_CARE_PROVIDER_SITE_OTHER): Payer: BLUE CROSS/BLUE SHIELD | Admitting: Licensed Clinical Social Worker

## 2017-07-06 DIAGNOSIS — F411 Generalized anxiety disorder: Secondary | ICD-10-CM

## 2017-07-06 NOTE — Progress Notes (Signed)
   THERAPIST PROGRESS NOTE  Session Time: 9:04am-10:06am  Participation Level: Active  Behavioral Response: CasualAlertEuthymic  Type of Therapy: Individual Therapy  Treatment Goals addressed: Reduce anxiety and increase confidence with making decisions independently  Interventions: Mindfulness  Suicidal/Homicidal: Denied both  Therapist Response: Introduced the concept of mindfulness.  Emphasized how learning to focus on the present can help you to feel more in control of your emotions.  Explained how it can be useful to practice at times when you catch yourself having unhelpful thoughts.  Described how you can practice mindfulness by doing tasks in a mindful way, focusing on an object, or through focused breathing.  Encouraged patient to practice the skills regularly in addition to times of distress.  Recommended a variety of mindfulness apps.   Summary:  Reported he has done some meditation in the past, but noted it only seemed minimally effective.  Said that the way he practiced was different than what was described today.   Indicated he is open to practicing.  Said "I'll definitely give it a go."  Also noted that he may share the information with his girlfriend who has a lot of issues with anxiety.  Reported most days his anxiety has been at about a 4, but had one day when it was up at an 8.  He had gotten fed up with things at work and decided to quit.  Plan: Will schedule next appointment for early January.  Will check on implementation of mindfulness skills.  Diagnosis: Generalized Anxiety Disorder    Darrin LuisSolomon, Blaine Hari A, LCSW 07/06/2017

## 2017-07-30 ENCOUNTER — Ambulatory Visit (HOSPITAL_COMMUNITY): Payer: Self-pay | Admitting: Licensed Clinical Social Worker

## 2017-08-06 ENCOUNTER — Other Ambulatory Visit: Payer: Self-pay

## 2017-08-06 DIAGNOSIS — F32A Depression, unspecified: Secondary | ICD-10-CM

## 2017-08-06 DIAGNOSIS — F419 Anxiety disorder, unspecified: Principal | ICD-10-CM

## 2017-08-06 DIAGNOSIS — F329 Major depressive disorder, single episode, unspecified: Secondary | ICD-10-CM

## 2017-08-06 MED ORDER — CITALOPRAM HYDROBROMIDE 20 MG PO TABS: 20.0000 mg | ORAL_TABLET | Freq: Every day | ORAL | 3 refills | Status: DC

## 2017-08-20 ENCOUNTER — Other Ambulatory Visit: Payer: Self-pay

## 2017-08-20 DIAGNOSIS — F419 Anxiety disorder, unspecified: Principal | ICD-10-CM

## 2017-08-20 DIAGNOSIS — F329 Major depressive disorder, single episode, unspecified: Secondary | ICD-10-CM

## 2017-08-20 DIAGNOSIS — F32A Depression, unspecified: Secondary | ICD-10-CM

## 2017-08-20 MED ORDER — CITALOPRAM HYDROBROMIDE 20 MG PO TABS: 20.0000 mg | ORAL_TABLET | Freq: Every day | ORAL | 1 refills | Status: DC

## 2018-07-01 DIAGNOSIS — S0990XA Unspecified injury of head, initial encounter: Secondary | ICD-10-CM | POA: Diagnosis present

## 2018-07-01 DIAGNOSIS — W0110XA Fall on same level from slipping, tripping and stumbling with subsequent striking against unspecified object, initial encounter: Secondary | ICD-10-CM | POA: Diagnosis not present

## 2018-07-01 DIAGNOSIS — Z79899 Other long term (current) drug therapy: Secondary | ICD-10-CM | POA: Insufficient documentation

## 2018-07-01 DIAGNOSIS — Y939 Activity, unspecified: Secondary | ICD-10-CM | POA: Insufficient documentation

## 2018-07-01 DIAGNOSIS — S060X0A Concussion without loss of consciousness, initial encounter: Secondary | ICD-10-CM | POA: Insufficient documentation

## 2018-07-01 DIAGNOSIS — S8001XA Contusion of right knee, initial encounter: Secondary | ICD-10-CM | POA: Insufficient documentation

## 2018-07-01 DIAGNOSIS — S8002XA Contusion of left knee, initial encounter: Secondary | ICD-10-CM | POA: Insufficient documentation

## 2018-07-01 DIAGNOSIS — Y929 Unspecified place or not applicable: Secondary | ICD-10-CM | POA: Insufficient documentation

## 2018-07-01 DIAGNOSIS — Y999 Unspecified external cause status: Secondary | ICD-10-CM | POA: Diagnosis not present

## 2018-07-01 DIAGNOSIS — S0083XA Contusion of other part of head, initial encounter: Secondary | ICD-10-CM | POA: Diagnosis not present

## 2018-07-02 ENCOUNTER — Emergency Department (HOSPITAL_BASED_OUTPATIENT_CLINIC_OR_DEPARTMENT_OTHER)
Admission: EM | Admit: 2018-07-02 | Discharge: 2018-07-02 | Disposition: A | Discharge status: Home / Self Care | Payer: BLUE CROSS/BLUE SHIELD | Attending: Emergency Medicine | Admitting: Emergency Medicine

## 2018-07-02 ENCOUNTER — Encounter (HOSPITAL_BASED_OUTPATIENT_CLINIC_OR_DEPARTMENT_OTHER): Payer: Self-pay | Admitting: Emergency Medicine

## 2018-07-02 ENCOUNTER — Other Ambulatory Visit: Payer: Self-pay

## 2018-07-02 DIAGNOSIS — S8001XA Contusion of right knee, initial encounter: Secondary | ICD-10-CM

## 2018-07-02 DIAGNOSIS — S8002XA Contusion of left knee, initial encounter: Secondary | ICD-10-CM

## 2018-07-02 DIAGNOSIS — S060X0A Concussion without loss of consciousness, initial encounter: Secondary | ICD-10-CM

## 2018-07-02 DIAGNOSIS — S0083XA Contusion of other part of head, initial encounter: Secondary | ICD-10-CM

## 2018-07-02 DIAGNOSIS — W010XXA Fall on same level from slipping, tripping and stumbling without subsequent striking against object, initial encounter: Secondary | ICD-10-CM

## 2018-07-02 NOTE — ED Triage Notes (Signed)
Pt states he had a fall tonight and hit his head, feeling dizzy with 3/10 pain at this time.

## 2018-07-02 NOTE — Discharge Instructions (Signed)
Apply ice as needed.  Take ibuprofen, naproxen, or acetaminophen as needed for pain.

## 2018-07-02 NOTE — ED Notes (Signed)
ED Provider at bedside. 

## 2018-07-02 NOTE — ED Provider Notes (Signed)
[facility] EMERGENCY DEPARTMENT Provider Note   CSN: [id number] Arrival date & time: 07/01/18  2356     History   Chief Complaint Chief Complaint  Patient presents with  . Fall    HPI Bruce Hudson is a 27 y.o. male.  The history is provided by the patient.  He tripped and fell and hit the right side of his forehead and both knees.  There was no loss of consciousness, but he felt dizzy as if he were drunk.  He denies any visual disturbance, nausea, vomiting.  Symptoms are improving.  He currently rates pain in his forehead at 3/10, pain in his knees at 6/10.  History reviewed. No pertinent past medical history.  Patient Active Problem List   Diagnosis Date Noted  . Generalized anxiety disorder 05/21/2017  . Hemorrhoids 04/20/2017  . Annual physical exam 04/20/2017  . Skin tag 04/20/2017  . Anxiety and depression 11/04/2015  . Attention deficit disorder 11/04/2015  . Gilbert syndrome 04/02/2015  . Left-sided chest wall pain 02/23/2015  . Patellofemoral pain syndrome 12/31/2013    History reviewed. No pertinent surgical history.      Home Medications    Prior to Admission medications   Medication Sig Start Date End Date Taking? Authorizing Provider  citalopram (CELEXA) 20 MG tablet Take 1 tablet (20 mg total) by mouth daily. 08/20/17   Monica Bectonhekkekandam, Thomas J, MD  docusate sodium (COLACE) 100 MG capsule Take 1 capsule (100 mg total) by mouth 3 (three) times daily as needed. 04/20/17   Monica Bectonhekkekandam, Thomas J, MD  hydrocortisone (ANUSOL-HC) 25 MG suppository Place 1 suppository (25 mg total) rectally 2 (two) times daily as needed for hemorrhoids. Patient not taking: Reported on 05/25/2017 04/20/17   Monica Bectonhekkekandam, Thomas J, MD  meloxicam (MOBIC) 15 MG tablet Take 1 tablet (15 mg total) by mouth daily. Patient not taking: Reported on 05/25/2017 11/04/15   Monica Bectonhekkekandam, Thomas J, MD  witch hazel-glycerin (TUCKS) pad Apply 1 application topically as needed. Patient  not taking: Reported on 05/25/2017 04/20/17   Monica Bectonhekkekandam, Thomas J, MD    Family History No family history on file.  Social History Social History   Tobacco Use  . Smoking status: Never Smoker  . Smokeless tobacco: Never Used  Substance Use Topics  . Alcohol use: Not on file  . Drug use: Not on file     Allergies   Hepatitis b virus vaccine   Review of Systems Review of Systems  All other systems reviewed and are negative.    Physical Exam Updated Vital Signs BP 126/89 (BP Location: Left Arm)   Pulse 67   Temp 98.4 F (36.9 C) (Oral)   Resp 17   Ht 6\' 3"  (1.905 m)   Wt 67.1 kg   SpO2 95%   BMI 18.50 kg/m   Physical Exam  Nursing note and vitals reviewed.  27 year old male, resting comfortably and in no acute distress. Vital signs are normal. Oxygen saturation is 95%, which is normal. Head is normocephalic.  Small hematoma present right side of forehead. PERRLA, EOMI. Oropharynx is clear. Neck is nontender and supple without adenopathy or JVD. Back is nontender and there is no CVA tenderness. Lungs are clear without rales, wheezes, or rhonchi. Chest is nontender. Heart has regular rate and rhythm without murmur. Abdomen is soft, flat, nontender without masses or hepatosplenomegaly and peristalsis is normoactive. Extremities have no cyanosis or edema.  Mild erythema and swelling anterior aspect of both knees.  No effusion  present in either knee.  There is no instability of the knees to valgus or varus stress.  Lachman and McMurray's tests are negative.  Full range of motion of all other joints without pain. Skin is warm and dry without rash. Neurologic: Mental status is normal, cranial nerves are intact, there are no motor or sensory deficits.  ED Treatments / Results   Procedures Procedures   Medications Ordered in ED Medications - No data to display   Initial Impression / Assessment and Plan / ED Course  I have reviewed the triage vital signs and the  nursing notes.  Fall with head injury and mild concussion.  No indications for CT scan at this time.  Contusion of both knees.  He is sent home with routine concussion instructions and advised to apply ice to painful areas.  ~Use over-the-counter analgesics as needed.  Return precautions discussed.  Old records are reviewed, and he has no relevant past visits.  Final Clinical Impressions(s) / ED Diagnoses   Final diagnoses:  Fall from slip, trip, or stumble, initial encounter  Concussion without loss of consciousness, initial encounter  Contusion of forehead, initial encounter  Contusion of left knee, initial encounter  Contusion of right knee, initial encounter    ED Discharge Orders    None       Dione Booze, MD 07/02/18 0211

## 2021-06-25 ENCOUNTER — Other Ambulatory Visit: Payer: Self-pay

## 2021-06-25 ENCOUNTER — Emergency Department (INDEPENDENT_AMBULATORY_CARE_PROVIDER_SITE_OTHER): Payer: 59

## 2021-06-25 ENCOUNTER — Encounter: Payer: Self-pay | Admitting: Emergency Medicine

## 2021-06-25 ENCOUNTER — Emergency Department
Admission: EM | Admit: 2021-06-25 | Discharge: 2021-06-25 | Disposition: A | Discharge status: Home / Self Care | Payer: 59 | Source: Home / Self Care | Attending: Family Medicine | Admitting: Family Medicine

## 2021-06-25 DIAGNOSIS — R0789 Other chest pain: Secondary | ICD-10-CM | POA: Diagnosis not present

## 2021-06-25 DIAGNOSIS — R0781 Pleurodynia: Secondary | ICD-10-CM

## 2021-06-25 DIAGNOSIS — R059 Cough, unspecified: Secondary | ICD-10-CM | POA: Diagnosis not present

## 2021-06-25 NOTE — Discharge Instructions (Signed)
Is reassuring that your  chest x-ray and your EKG are normal It is likely that your chest pain is from stress Take ibuprofen or Aleve for the pain Reduce your caffeine intake Call your primary care doctor on Monday for a follow-up appointment

## 2021-06-25 NOTE — ED Provider Notes (Signed)
[facility]    CSN: [id number] Arrival date & time: 06/25/21  [id number]      History   Chief Complaint Chief Complaint  Patient presents with   Pleurisy    HPI Bruce Hudson is a 30 y.o. male.   HPI Pleasant 30 year old gentleman who lives in [place] but is home visiting his parents for the Thanksgiving holiday.  He developed some left-sided chest pain and dry cough for the last couple of days.  Hurts with a deep breath.  He also states his right upper arm feels somewhat stiff and limited.  Patient states he has had the symptoms at least 3 times over the last 6 months.  He states that he has discussed this with his physician but has not had any work-up.  They did do blood work to include metabolic panel and thyroid.  Patient states this time he noticed the symptoms after he drank a cold brew.  After he drank it he realized that it had a very high amount of caffeine in it.  He did not notice tachycardia or shortness of breath.  States he has a long history of anxiety and is under a lot of stress at home and at work.  History reviewed. No pertinent past medical history.  Patient Active Problem List   Diagnosis Date Noted   Generalized anxiety disorder 05/21/2017   Hemorrhoids 04/20/2017   Annual physical exam 04/20/2017   Skin tag 04/20/2017   Anxiety and depression 11/04/2015   Attention deficit disorder 11/04/2015   Bruce Hudson syndrome 04/02/2015   Left-sided chest wall pain 02/23/2015   Patellofemoral pain syndrome 12/31/2013    History reviewed. No pertinent surgical history.     Home Medications    Prior to Admission medications   Medication Sig Start Date End Date Taking? Authorizing Provider  amphetamine-dextroamphetamine (ADDERALL XR) 25 MG 24 hr capsule 1 capsule in the morning 06/20/21  Yes [provider]  escitalopram (LEXAPRO) 10 MG tablet 1 tablet 05/29/18  Yes [provider]  aspirin-acetaminophen-caffeine (EXCEDRIN MIGRAINE)  250-250-65 MG tablet 2 tablets    [provider]    Family History Family History  Problem Relation Age of Onset   Thyroid disease Mother    Lung disease Father     Social History Social History   Tobacco Use   Smoking status: Never    Passive exposure: Never   Smokeless tobacco: Never  Vaping Use   Vaping Use: Never used  Substance Use Topics   Alcohol use: Yes    Alcohol/week: 2.0 standard drinks    Types: 2 Standard drinks or equivalent per week   Drug use: Not Currently     Allergies   Ampicillin, Doxycycline hyclate, and Hepatitis b virus vaccines   Review of Systems Review of Systems See HPI  Physical Exam Triage Vital Signs ED Triage Vitals  Enc Vitals Group     BP 06/25/21 1118 124/80     Pulse Rate 06/25/21 1118 94     Resp 06/25/21 1118 16     Temp 06/25/21 1118 99.2 F (37.3 C)     Temp Source 06/25/21 1118 Oral     SpO2 06/25/21 1118 96 %     Weight 06/25/21 1123 155 lb (70.3 kg)     Height 06/25/21 1123 6\' 3"  (1.905 m)     Head Circumference --      Peak Flow --      Pain Score 06/25/21 1122 3     Pain  Loc --      Pain Edu? --      Excl. in GC? --    No data found.  Updated Vital Signs BP 124/80 (BP Location: Left Arm)   Pulse 94   Temp 99.2 F (37.3 C) (Oral)   Resp 16   Ht 6\' 3"  (1.905 m)   Wt 70.3 kg   SpO2 96%   BMI 19.37 kg/m      Physical Exam Constitutional:      General: He is not in acute distress.    Appearance: He is well-developed. He is not ill-appearing.     Comments: Tall and thin  HENT:     Head: Normocephalic and atraumatic.     Right Ear: Tympanic membrane, ear canal and external ear normal.     Left Ear: Tympanic membrane, ear canal and external ear normal.     Nose: Nose normal. No congestion.     Mouth/Throat:     Mouth: Mucous membranes are moist.     Pharynx: No posterior oropharyngeal erythema.  Eyes:     Conjunctiva/sclera: Conjunctivae normal.     Pupils: Pupils are equal, round, and  reactive to light.  Cardiovascular:     Rate and Rhythm: Normal rate and regular rhythm.     Heart sounds: Normal heart sounds.  Pulmonary:     Effort: Pulmonary effort is normal. No respiratory distress.     Breath sounds: Normal breath sounds.  Chest:     Chest wall: No tenderness.  Abdominal:     General: Abdomen is flat. There is no distension.     Palpations: Abdomen is soft.     Tenderness: There is no abdominal tenderness.  Musculoskeletal:        General: Normal range of motion.     Cervical back: Normal range of motion.     Right lower leg: No edema.     Left lower leg: No edema.  Lymphadenopathy:     Cervical: No cervical adenopathy.  Skin:    General: Skin is warm and dry.  Neurological:     General: No focal deficit present.     Mental Status: He is alert.     Gait: Gait normal.     UC Treatments / Results  Labs (all labs ordered are listed, but only abnormal results are displayed) Labs Reviewed - No data to display  EKG   Radiology DG Chest 2 View  Result Date: 06/25/2021 CLINICAL DATA:  Left chest wall pain for 2 days.  Cough. EXAM: CHEST - 2 VIEW COMPARISON:  02/23/2015. FINDINGS: Cardiac silhouette is normal in size. Normal mediastinal and hilar contours. Clear lungs.  No pleural effusion or pneumothorax. Skeletal structures are unremarkable. IMPRESSION: Normal PA and lateral chest radiographs. Electronically Signed   By: 02/25/2015 M.D.   On: 06/25/2021 12:26    Procedures Procedures (including critical care time)  Medications Ordered in UC Medications - No data to display  Initial Impression / Assessment and Plan / UC Course  I have reviewed the triage vital signs and the nursing notes.  Pertinent labs & imaging results that were available during my care of the patient were reviewed by me and considered in my medical decision making (see chart for details).     It is reassuring that his chest x-ray and EKG are normal.  He is given a copy of  these results to take to his primary care physician.  He is told to reduce his caffeine  intake.  Practice mindfulness.  Work on his anxiety with his PCP.  Go to ER if symptoms should worsen Final Clinical Impressions(s) / UC Diagnoses   Final diagnoses:  Pleuritic chest pain     Discharge Instructions      Is reassuring that your  chest x-ray and your EKG are normal It is likely that your chest pain is from stress Take ibuprofen or Aleve for the pain Reduce your caffeine intake Call your primary care doctor on Monday for a follow-up appointment    ED Prescriptions   None    PDMP not reviewed this encounter.   Raylene Everts, MD 06/25/21 [id number]

## 2021-06-25 NOTE — ED Triage Notes (Signed)
Left sided chest wall pain x 2 days w/ SOB  Dry cough Right upper arm pain - feels stiff per pt, limited ROM  Pt has same symptoms every other month Pt noticed the symptoms started after a cold brew this time

## 2023-03-09 IMAGING — DX DG CHEST 2V
2 series · 2 of 2 positions shown · non-contrast
Comparison: 02/23/2015.

CLINICAL DATA: Left chest wall pain for 2 days.  Cough.

EXAM:
CHEST - 2 VIEW

[chest pa]
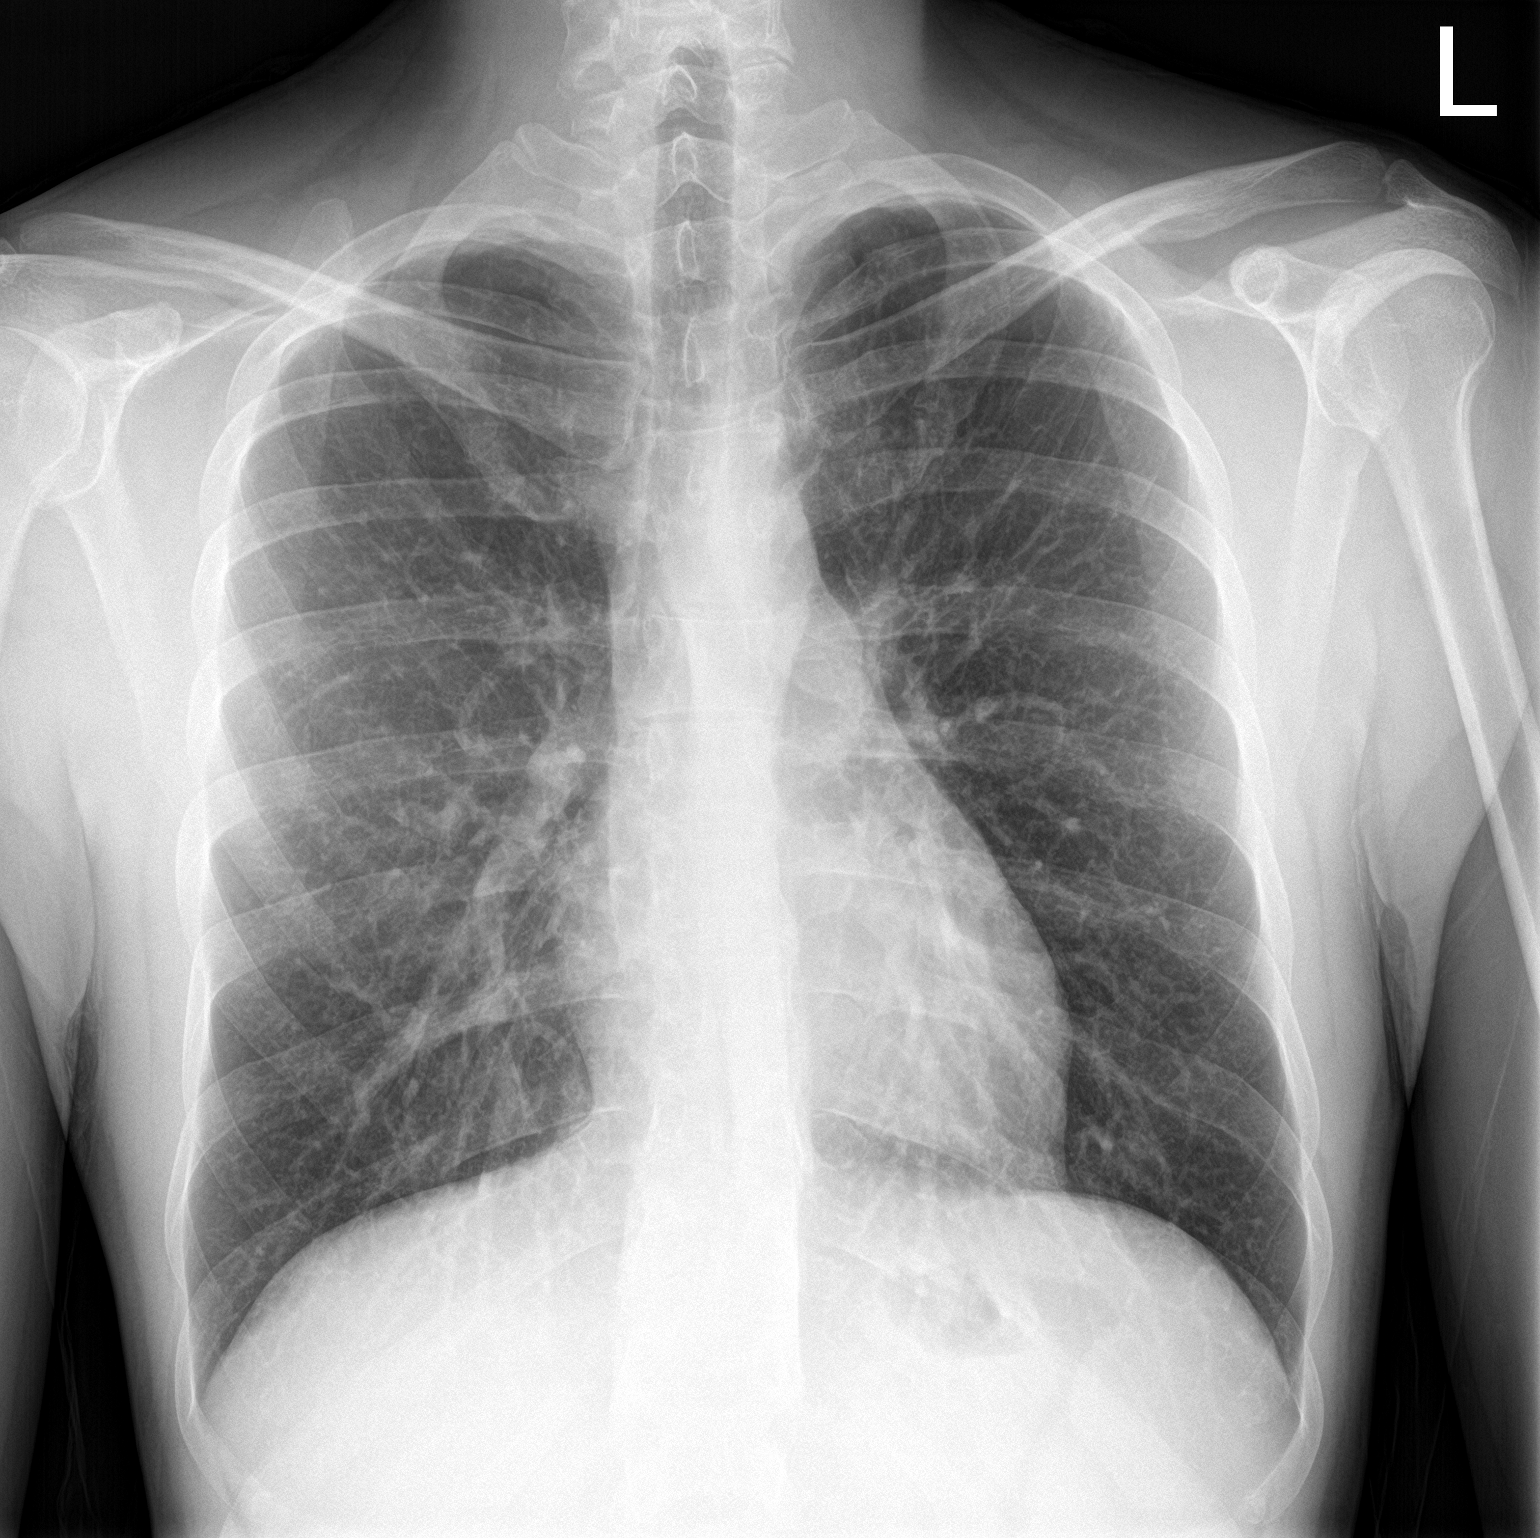

[chest lat]
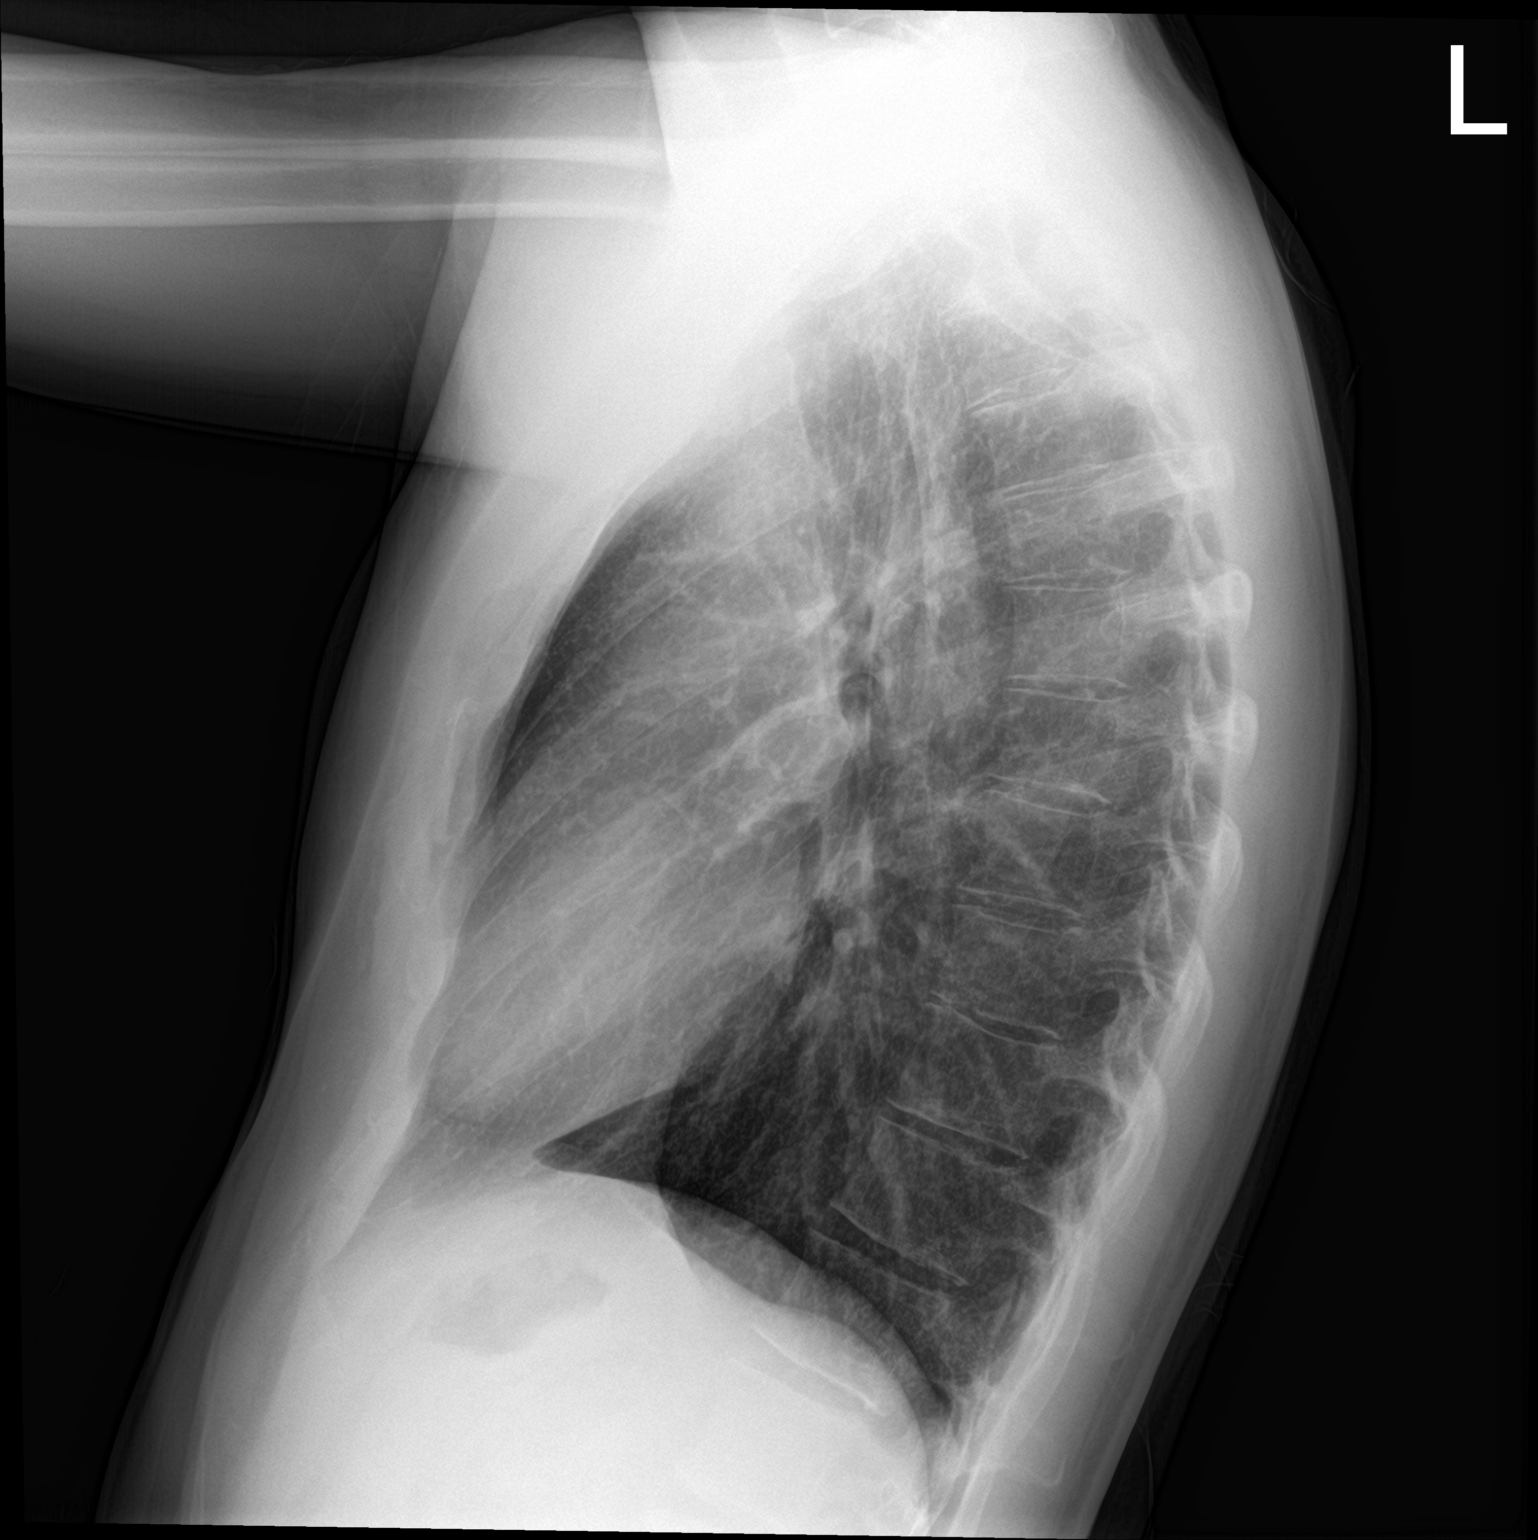

[2 of 2 positions shown; findings below may reference images not displayed]

FINDINGS: Cardiac silhouette is normal in size. Normal mediastinal and hilar
contours.

Clear lungs.  No pleural effusion or pneumothorax.

Skeletal structures are unremarkable.
IMPRESSION: Normal PA and lateral chest radiographs.
